# Patient Record
Sex: Female | Born: 1990 | Race: White | Hispanic: No | Marital: Married | Smoking: Never smoker
Health system: Southern US, Community
[De-identification: ages and names within clinical notes are randomized; demographics above are authoritative.]

## PROBLEM LIST (undated history)

## (undated) DIAGNOSIS — T7840XA Allergy, unspecified, initial encounter: Secondary | ICD-10-CM

## (undated) DIAGNOSIS — R51 Headache: Secondary | ICD-10-CM

## (undated) DIAGNOSIS — R519 Headache, unspecified: Secondary | ICD-10-CM

## (undated) DIAGNOSIS — N946 Dysmenorrhea, unspecified: Secondary | ICD-10-CM

## (undated) DIAGNOSIS — Z8619 Personal history of other infectious and parasitic diseases: Secondary | ICD-10-CM

## (undated) DIAGNOSIS — F419 Anxiety disorder, unspecified: Secondary | ICD-10-CM

## (undated) HISTORY — DX: Allergy, unspecified, initial encounter: T78.40XA

## (undated) HISTORY — DX: Dysmenorrhea, unspecified: N94.6

## (undated) HISTORY — PX: TONSILLECTOMY: SUR1361

## (undated) HISTORY — PX: MYRINGOTOMY: SHX2060

## (undated) HISTORY — PX: TYMPANOSTOMY TUBE PLACEMENT: SHX32

---
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
## 2010-03-11 ENCOUNTER — Ambulatory Visit: Payer: Self-pay | Admitting: Internal Medicine

## 2010-03-11 LAB — CONVERTED CEMR LAB: Hemoglobin: 14.2 g/dL

## 2010-03-16 LAB — CONVERTED CEMR LAB: GC Probe Amp, Urine: NEGATIVE

## 2010-05-25 ENCOUNTER — Ambulatory Visit: Payer: Self-pay | Admitting: Internal Medicine

## 2010-05-25 DIAGNOSIS — T50995A Adverse effect of other drugs, medicaments and biological substances, initial encounter: Secondary | ICD-10-CM

## 2010-05-25 DIAGNOSIS — F4320 Adjustment disorder, unspecified: Secondary | ICD-10-CM | POA: Insufficient documentation

## 2010-05-25 LAB — CONVERTED CEMR LAB: Thyroperoxidase Ab SerPl-aCnc: 48.2 (ref 0.0–60.0)

## 2010-05-27 LAB — CONVERTED CEMR LAB: Free T4: 0.81 ng/dL (ref 0.60–1.60)
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
## 2010-12-01 NOTE — Assessment & Plan Note (Signed)
Summary: cpx/ssc   Vital Signs:  Patient profile:   20 year old female Menstrual status:  regular LMP:     02/17/2010 Height:      63.75 inches Weight:      179 pounds BMI:     31.08 Pulse rate:   78 / minute BP sitting:   100 / 60  (left arm) Cuff size:   regular  Vitals Entered By: Romualdo Bolk, CMA [place]ull) (Mar 11, 2010 1:07 PM)  History of Present Illness: Alison Wagner comes in comes in today  for check up. Since last visit  here  there have been no major changes in health status  .    Dysmenorrhea: ocps helped her periods but till has sig cramps despite  nasids .   NO vag discharge  uses condoms  only 1 partner.  No execessive bleeding . No NVD .  CC: CPX LMP (date): 02/17/2010 LMP - Character: normal Menarche (age onset years): 11   Menses interval (days): 28 Menstrual flow (days): 4 Enter LMP: 02/17/2010   Preventive Screening-Counseling & Management  Alcohol-Tobacco     Alcohol drinks/day: tried-not currently using     Smoking Status: quit     Passive Smoke Exposure: yes  Caffeine-Diet-Exercise     Caffeine use/day: no carbonated, no caffeine     Diet Comments: all four food groups, picky eater  Hep-HIV-STD-Contraception     Dental Visit-last 6 months yes     Sun Exposure-Excessive: yes  Safety-Violence-Falls     Seat Belt Use: yes     Firearms in the Home: no firearms in the home     Smoke Detectors: yes      Blood Transfusions:  no.    Past History:  Past medical, surgical, family and social histories (including risk factors) reviewed, and no changes noted (except as noted below).  Past Medical History: Allergic rhinitis mild seasonal allergies  dysmenorrhea  Past Surgical History: Reviewed history from 04/05/2007 and no changes required. Myringotomy: P/E tubes  Past History:  Care Management: [facility] for acute problems due to it being closer to home.  Family History: Reviewed history from 09/25/2007 and no  changes required. ? gp with dm no clotting  no suddendeath Family History of Thyroid Disease mom  Social History: Reviewed history from 03/09/2009 and no changes required. 2 households mom in [place]  Lives with DAD here ocass etoh or tob with friends Parents are divorced; lives with: . Father  mom in [place]    [place] .    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   Seat Belt Use:  yes Sun Exposure-Excessive:  yes Blood Transfusions:  no  Review of Systems  The patient denies anorexia, fever, weight loss, weight gain, decreased hearing, prolonged cough, abdominal pain, hematochezia, severe indigestion/heartburn, hematuria, incontinence, genital sores, difficulty walking, depression, unusual weight change, abnormal bleeding, enlarged lymph nodes, angioedema, and breast masses.    Physical Exam  General:      Well appearing adolescent,no acute distress Head:      normocephalic and atraumatic  Eyes:      PERRL, EOMs full, conjunctiva clear  Ears:      TM's pearly gray with normal light reflex and landmarks, canals clear  Nose:      Clear without Rhinorrhea Mouth:      Clear without erythema, edema or exudate, mucous membranes moist teeht good repair Neck:      supple without  adenopathy  Chest wall:      no deformities or breast masses noted.  Tanner V Breast.   Lungs:      Clear to ausc, no crackles, rhonchi or wheezing, no grunting, flaring or retractions  Heart:      RRR without murmur quiet precordium.   Abdomen:      BS+, soft, non-tender, no masses, no hepatosplenomegaly  Genitalia:      normal female  Musculoskeletal:      no scoliosis, normal gait, normal posture ortho neg  Pulses:      femoral pulses present wihtout delay  Extremities:      Well perfused with no cyanosis or deformity noted  Neurologic:      Neurologic exam   intact  Developmental:      alert and cooperative  Skin:      intact without lesions, rashes   Cervical nodes:       no significant adenopathy.   Axillary nodes:      no significant adenopathy.   Inguinal nodes:      no significant adenopathy.   Psychiatric:      alert and cooperative   Impression & Recommendations:  Problem # 1:  PREVENTIVE HEALTH CARE (ICD-V70.0) Discussed nutrition,exercise,diet,healthy weight, vitamin D and calcium.      Orders: Hgb (91478) T-Chlamydia & GC Probe, Urine ([id number]) Fingerstick (29562) Specimen Handling (13086)  Problem # 2:  PRIMARY DYSMENORRHEA (ICD-625.3) Assessment: Deteriorated still problematic   will change estrogen  level of ocps   and   follow up  add rx nsaid .  Problem # 3:  SCREENING EXAMINATION FOR VENEREAL DISEASE (ICD-V74.5)  Orders: T-Chlamydia & GC Probe, Urine ([id number])  Complete Medication List: 1)  Levora 0.15/30 (28) 0.15-30 Mg-mcg Tabs (Levonorgestrel-ethinyl estrad) .Marland Kitchen.. 1 by mouth once daily 2)  Anaprox Ds 550 Mg Tabs (Naproxen sodium) .Marland Kitchen.. 1 by mouth two times a day for cramps  Patient Instructions: 1)  change OCPS  2)  take cramp antiinflammatory med early for pain. 3)  You will be informed of lab results when available. 4)  Please schedule a follow-up appointment in 3 months .  or as needed.  Prescriptions: ANAPROX DS 550 MG TABS (NAPROXEN SODIUM) 1 by mouth two times a day for cramps  #60 x 2   Entered and Authorized by:   Madelin Headings MD   Signed by:   Madelin Headings MD on 03/11/2010   Method used:   Electronically to        [facility] [id number] (retail)       [address] [place], [place]  [address]       Ph: [phone]       Fax: [phone]   RxID:   [id number] LEVORA 0.15/30 (28) 0.15-30 MG-MCG TABS (LEVONORGESTREL-ETHINYL ESTRAD) 1 by mouth once daily  #1 x 12   Entered and Authorized by:   Madelin Headings MD   Signed by:   Madelin Headings MD on 03/11/2010   Method used:   Electronically to        [facility] [address] (retail)       [address] [place], [place]   [address]       Ph: [phone]       Fax: [phone]   RxID:   [id number]  ] Laboratory Results   Blood Tests   Date/Time Recieved: Mar 11, 2010 1:54 PM  Date/Time Reported: Mar 11, 2010 1:54 PM    CBC HGB:  14.2 g/dL   (Normal Range: 16.1-09.6 in Males, 12.0-15.0 in Females) Comments: Wynona Canes, CMA  Mar 11, 2010 1:54 PM

## 2011-02-27 ENCOUNTER — Other Ambulatory Visit: Payer: Self-pay | Admitting: Internal Medicine

## 2011-02-27 ENCOUNTER — Other Ambulatory Visit: Payer: Self-pay | Admitting: *Deleted

## 2011-02-27 ENCOUNTER — Telehealth: Payer: Self-pay | Admitting: *Deleted

## 2011-02-27 MED ORDER — LEVONORGESTREL-ETHINYL ESTRAD 0.15-30 MG-MCG PO TABS: 1.0000 | ORAL_TABLET | Freq: Every day | ORAL | Status: DC

## 2011-02-27 NOTE — Telephone Encounter (Signed)
Rx sent to pharmacy   

## 2011-02-27 NOTE — Telephone Encounter (Signed)
Will be out of Levora birth control next Monday.  Appt schedule for 03/21/11 for yearly check up. Would like 1 refill called in until appt.

## 2011-03-16 ENCOUNTER — Encounter: Payer: Self-pay | Admitting: Internal Medicine

## 2011-03-21 ENCOUNTER — Ambulatory Visit (INDEPENDENT_AMBULATORY_CARE_PROVIDER_SITE_OTHER): Payer: Federal, State, Local not specified - PPO | Admitting: Internal Medicine

## 2011-03-21 ENCOUNTER — Encounter: Payer: Self-pay | Admitting: Internal Medicine

## 2011-03-21 VITALS — BP 130/80 | HR 78 | Ht 64.0 in | Wt 179.0 lb

## 2011-03-21 DIAGNOSIS — Z0000 Encounter for general adult medical examination without abnormal findings: Secondary | ICD-10-CM

## 2011-03-21 DIAGNOSIS — N946 Dysmenorrhea, unspecified: Secondary | ICD-10-CM

## 2011-03-21 DIAGNOSIS — IMO0001 Reserved for inherently not codable concepts without codable children: Secondary | ICD-10-CM | POA: Insufficient documentation

## 2011-03-21 DIAGNOSIS — R109 Unspecified abdominal pain: Secondary | ICD-10-CM | POA: Insufficient documentation

## 2011-03-21 LAB — POCT HEMOGLOBIN: Hemoglobin: 13.7

## 2011-03-21 MED ORDER — LEVONORGESTREL-ETHINYL ESTRAD 0.1-20 MG-MCG PO TABS: 1.0000 | ORAL_TABLET | Freq: Every day | ORAL | Status: DC

## 2011-03-21 NOTE — Patient Instructions (Signed)
Will do gyne referral  About your pain Track the pain episodes and  Periods. unclear if you could have a cyst/ endometriosis  Or there causes of your pain.  Follow up with me about pain  Not occurring with the period.    Before you back to school

## 2011-03-21 NOTE — Progress Notes (Signed)
  Subjective:    Patient ID: Alison Wagner, female    DOB: 05-06-1991, 20 y.o.   MRN: [id number]  HPI Patient comes in for a checkup preventive visit today. Since her last visit we had changed her birth control pill because of breakthrough cramps. She states that she still gets cramps during her period. Pain      With menses and takes naproxy but also gets pain occasionally at other times about 2 x per months? If  Related . No nausea vomiting diarrhea feels like cramps no change in bowel habits. Had sti checks within 6 months at  [facility]    2 year relationship.  Wants advice and input about how to manage the pain with her she would benefit with a muscle relaxer or   other pain pill. Sib has endometriosis   .  Dr Aldona Bar  Review of Systems Negative HEENT negative cardiovascular pulmonary change in bowel habits joint pains bruising bleeding depression. Rest as per history of present illness    Objective:   Physical Exam Physical Exam: Vital signs reviewed [id number] is a well-developed well-nourished alert cooperative  white female who appears her stated age in no acute distress.  HEENT: normocephalic  traumatic , Eyes: PERRL EOM's full, conjunctiva clear, Nares: paten,t no deformity discharge or tenderness., Ears: no deformity EAC's clear TMs with normal landmarks. Mouth: clear OP, no lesions, edema.  Moist mucous membranes. Dentition in adequate repair. NECK: supple without masses, thyromegaly or bruits. CHEST/PULM:  Clear to auscultation and percussion breath sounds equal no wheeze , rales or rhonchi. No chest wall deformities or tenderness. Breast: normal by inspection . No dimpling, discharge, masses, tenderness or discharge . LN: no cervical axillary inguinal adenopathy CV: PMI is nondisplaced, S1 S2 no gallops, murmurs, rubs. Peripheral pulses are full without delay.No JVD .  ABDOMEN: Bowel sounds normal nontender  No guard or rebound, no hepato splenomegal no CVA tenderness.  No hernia.  Points to right pelvic area as area of concern when hurts.  Extremtities:  No clubbing cyanosis or edema, no acute joint swelling or redness no focal atrophy NEURO:  Oriented x3, cranial nerves 3-12 appear to be intact, no obvious focal weakness,gait within normal limits no abnormal reflexes or asymmetrical SKIN: No acute rashes normal turgor, color, no bruising or petechiae. Piercing.  PSYCH: Oriented, good eye contact, no obvious depression anxiety, cognition and judgment appear normal.     Assessment & Plan:  Preventive Health Care UTD  Had sti screen at school  Counseled regarding healthy nutrition, exercise, sleep, injury prevention, calcium vit d and healthy weight .  OCPs    Dysmenorrhea   Still break through with other abd pain. Unclear cause  Would avoid  Narcotics  And other type pain pills at present until  reason for pain  Better delineated. ? endometriosis or cyst?    Counseled. About course and  REC.   GYNE consult to  Consider other treatments.

## 2011-07-27 LAB — POCT PREGNANCY, URINE
Operator id: 284251
Preg Test, Ur: NEGATIVE

## 2011-07-27 LAB — URINALYSIS, ROUTINE W REFLEX MICROSCOPIC
Bilirubin Urine: NEGATIVE
Nitrite: NEGATIVE
Protein, ur: NEGATIVE
Specific Gravity, Urine: 1.007
Urobilinogen, UA: 0.2

## 2011-07-27 LAB — RAPID URINE DRUG SCREEN, HOSP PERFORMED
Amphetamines: NOT DETECTED
Cocaine: NOT DETECTED
Opiates: NOT DETECTED
Tetrahydrocannabinol: NOT DETECTED

## 2011-09-12 ENCOUNTER — Other Ambulatory Visit: Payer: Self-pay | Admitting: Internal Medicine

## 2012-02-24 ENCOUNTER — Other Ambulatory Visit: Payer: Self-pay | Admitting: Internal Medicine

## 2012-02-26 ENCOUNTER — Telehealth: Payer: Self-pay | Admitting: Internal Medicine

## 2012-02-26 NOTE — Telephone Encounter (Signed)
Pt requesting refill on LUTERA 0.1-20 MG-MCG tablet  [facility] [address] in [place]

## 2012-02-27 NOTE — Telephone Encounter (Signed)
Rx sent to pharmacy   

## 2012-03-26 ENCOUNTER — Encounter (HOSPITAL_BASED_OUTPATIENT_CLINIC_OR_DEPARTMENT_OTHER): Payer: Self-pay | Admitting: *Deleted

## 2012-03-26 ENCOUNTER — Telehealth: Payer: Self-pay | Admitting: Internal Medicine

## 2012-03-26 DIAGNOSIS — S301XXA Contusion of abdominal wall, initial encounter: Secondary | ICD-10-CM | POA: Insufficient documentation

## 2012-03-26 DIAGNOSIS — M79609 Pain in unspecified limb: Secondary | ICD-10-CM | POA: Insufficient documentation

## 2012-03-26 DIAGNOSIS — S62339A Displaced fracture of neck of unspecified metacarpal bone, initial encounter for closed fracture: Secondary | ICD-10-CM | POA: Insufficient documentation

## 2012-03-26 DIAGNOSIS — R109 Unspecified abdominal pain: Secondary | ICD-10-CM | POA: Insufficient documentation

## 2012-03-26 DIAGNOSIS — S60229A Contusion of unspecified hand, initial encounter: Secondary | ICD-10-CM | POA: Insufficient documentation

## 2012-03-26 DIAGNOSIS — S8010XA Contusion of unspecified lower leg, initial encounter: Secondary | ICD-10-CM | POA: Insufficient documentation

## 2012-03-26 MED ORDER — LEVONORGESTREL-ETHINYL ESTRAD 0.1-20 MG-MCG PO TABS: 1.0000 | ORAL_TABLET | Freq: Every day | ORAL | Status: DC

## 2012-03-26 NOTE — Telephone Encounter (Signed)
Pt called and is req refill of LUTERA 0.1-20 MG-MCG tablet to be called in to [facility] in [place].

## 2012-03-26 NOTE — Telephone Encounter (Signed)
Per Dr. Fabian Sharp called pt to see what happended with GYNE referral and to make pt aware. Pt is aware and states she will make an appt. Rx sent to pharmacy.
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
## 2012-03-27 ENCOUNTER — Emergency Department (HOSPITAL_BASED_OUTPATIENT_CLINIC_OR_DEPARTMENT_OTHER): Payer: Federal, State, Local not specified - PPO

## 2012-03-27 ENCOUNTER — Emergency Department (HOSPITAL_BASED_OUTPATIENT_CLINIC_OR_DEPARTMENT_OTHER)
Admission: EM | Admit: 2012-03-27 | Discharge: 2012-03-27 | Disposition: A | Discharge status: Home / Self Care | Payer: Federal, State, Local not specified - PPO | Attending: Emergency Medicine | Admitting: Emergency Medicine

## 2012-03-27 DIAGNOSIS — S92352A Displaced fracture of fifth metatarsal bone, left foot, initial encounter for closed fracture: Secondary | ICD-10-CM

## 2012-03-27 LAB — URINALYSIS, ROUTINE W REFLEX MICROSCOPIC
Bilirubin Urine: NEGATIVE
Ketones, ur: NEGATIVE mg/dL
Nitrite: NEGATIVE
Urobilinogen, UA: 0.2 mg/dL (ref 0.0–1.0)

## 2012-03-27 MED ORDER — SODIUM CHLORIDE 0.9 % IV SOLN
INTRAVENOUS | Status: DC
  Administered 2012-03-27: 03:00:00 via INTRAVENOUS

## 2012-03-27 MED ORDER — MORPHINE SULFATE 4 MG/ML IJ SOLN
6.0000 mg | Freq: Once | INTRAMUSCULAR | Status: AC
  Administered 2012-03-27: 6 mg via INTRAVENOUS
  Filled 2012-03-27: qty 2

## 2012-03-27 MED ORDER — IOHEXOL 300 MG/ML  SOLN
100.0000 mL | Freq: Once | INTRAMUSCULAR | Status: AC | PRN
  Administered 2012-03-27: 100 mL via INTRAVENOUS

## 2012-03-27 MED ORDER — HYDROCODONE-ACETAMINOPHEN 5-325 MG PO TABS: 2.0000 | ORAL_TABLET | ORAL | Status: AC | PRN

## 2012-03-27 NOTE — ED Notes (Signed)
MD at bedside. 
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
## 2012-03-27 NOTE — ED Notes (Signed)
Pt ambulated to restroom with a steady gait. NAD noted.

## 2012-03-27 NOTE — ED Notes (Signed)
Patient transported to X-ray 

## 2012-03-29 ENCOUNTER — Ambulatory Visit: Payer: Federal, State, Local not specified - PPO | Admitting: Internal Medicine

## 2012-04-02 ENCOUNTER — Telehealth: Payer: Self-pay | Admitting: Internal Medicine

## 2012-04-02 NOTE — Telephone Encounter (Signed)
Triage the call  If needed she can come for problem based  Office visit   Before  Scheduled preventive visit .

## 2012-04-02 NOTE — Telephone Encounter (Signed)
Pls advise.  
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
## 2012-04-03 NOTE — Telephone Encounter (Signed)
Left a message for pt to return call 

## 2012-04-04 NOTE — Telephone Encounter (Signed)
Left a message for pt to return call 
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
## 2012-04-04 NOTE — Telephone Encounter (Signed)
If she wishes can come in next Tuesday JUne 11th 1:15 block 30 minutes

## 2012-04-05 NOTE — Telephone Encounter (Signed)
Pls schedule

## 2012-04-05 NOTE — Telephone Encounter (Signed)
Left message for pt to call back  °

## 2012-04-08 NOTE — Telephone Encounter (Signed)
Left message for pt to return call.

## 2012-04-09 ENCOUNTER — Encounter: Payer: Self-pay | Admitting: Internal Medicine

## 2012-04-09 ENCOUNTER — Other Ambulatory Visit (HOSPITAL_COMMUNITY)
Admission: RE | Admit: 2012-04-09 | Discharge: 2012-04-09 | Disposition: A | Discharge status: Home / Self Care | Payer: Federal, State, Local not specified - PPO | Source: Ambulatory Visit | Attending: Internal Medicine | Admitting: Internal Medicine

## 2012-04-09 ENCOUNTER — Ambulatory Visit (INDEPENDENT_AMBULATORY_CARE_PROVIDER_SITE_OTHER): Payer: Federal, State, Local not specified - PPO | Admitting: Internal Medicine

## 2012-04-09 VITALS — BP 126/86 | HR 89 | Temp 98.3°F | Ht 64.5 in | Wt 177.0 lb

## 2012-04-09 DIAGNOSIS — T07XXXA Unspecified multiple injuries, initial encounter: Secondary | ICD-10-CM

## 2012-04-09 DIAGNOSIS — Z01419 Encounter for gynecological examination (general) (routine) without abnormal findings: Secondary | ICD-10-CM

## 2012-04-09 DIAGNOSIS — S62309A Unspecified fracture of unspecified metacarpal bone, initial encounter for closed fracture: Secondary | ICD-10-CM

## 2012-04-09 DIAGNOSIS — Z0000 Encounter for general adult medical examination without abnormal findings: Secondary | ICD-10-CM

## 2012-04-09 DIAGNOSIS — Z113 Encounter for screening for infections with a predominantly sexual mode of transmission: Secondary | ICD-10-CM | POA: Insufficient documentation

## 2012-04-09 LAB — HEPATIC FUNCTION PANEL
AST: 19 U/L (ref 0–37)
Albumin: 3.8 g/dL (ref 3.5–5.2)
Total Bilirubin: 0.3 mg/dL (ref 0.3–1.2)

## 2012-04-09 LAB — CBC WITH DIFFERENTIAL/PLATELET
Basophils Absolute: 0 10*3/uL (ref 0.0–0.1)
Eosinophils Absolute: 0.1 10*3/uL (ref 0.0–0.7)
HCT: 38.7 % (ref 36.0–46.0)
Lymphs Abs: 2.1 10*3/uL (ref 0.7–4.0)
MCHC: 33.1 g/dL (ref 30.0–36.0)
Monocytes Absolute: 0.3 10*3/uL (ref 0.1–1.0)
Monocytes Relative: 4 % (ref 3.0–12.0)
Platelets: 238 10*3/uL (ref 150.0–400.0)
RDW: 12.3 % (ref 11.5–14.6)

## 2012-04-09 LAB — BASIC METABOLIC PANEL
BUN: 8 mg/dL (ref 6–23)
CO2: 27 mEq/L (ref 19–32)
Calcium: 8.8 mg/dL (ref 8.4–10.5)
GFR: 126.28 mL/min (ref >60.00)
Glucose, Bld: 69 mg/dL — ABNORMAL LOW (ref 70–99)
Potassium: 3.9 mEq/L (ref 3.5–5.1)

## 2012-04-09 LAB — LIPID PANEL
Cholesterol: 131 mg/dL (ref 0–200)
HDL: 41.7 mg/dL (ref >39.00)
LDL Cholesterol: 71 mg/dL (ref 0–99)
Triglycerides: 91 mg/dL (ref 0.0–149.0)
VLDL: 18.2 mg/dL (ref 0.0–40.0)

## 2012-04-09 MED ORDER — LEVONORGESTREL-ETHINYL ESTRAD 0.1-20 MG-MCG PO TABS: 1.0000 | ORAL_TABLET | Freq: Every day | ORAL | Status: DC

## 2012-04-09 NOTE — Patient Instructions (Signed)
Will notify you  of lab PAP  when available. Continue same med  Recheck in a year or as needed
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
## 2012-04-10 ENCOUNTER — Encounter: Payer: Self-pay | Admitting: Family Medicine

## 2012-04-10 NOTE — Progress Notes (Signed)
Left a message on voicemail for the pt to call back. 

## 2012-04-10 NOTE — Progress Notes (Signed)
Left a message on voicemail for the pt to call back.

## 2012-04-11 NOTE — Progress Notes (Signed)
Quick Note:  Tell patient PAP is normal. ______ 

## 2012-04-12 ENCOUNTER — Encounter: Payer: Self-pay | Admitting: Family Medicine

## 2012-04-16 ENCOUNTER — Encounter: Payer: Self-pay | Admitting: Internal Medicine

## 2012-04-16 DIAGNOSIS — T07XXXA Unspecified multiple injuries, initial encounter: Secondary | ICD-10-CM | POA: Insufficient documentation

## 2012-04-16 DIAGNOSIS — Z01419 Encounter for gynecological examination (general) (routine) without abnormal findings: Secondary | ICD-10-CM | POA: Insufficient documentation

## 2012-04-16 DIAGNOSIS — S62309A Unspecified fracture of unspecified metacarpal bone, initial encounter for closed fracture: Secondary | ICD-10-CM | POA: Insufficient documentation

## 2012-04-26 ENCOUNTER — Encounter: Payer: Federal, State, Local not specified - PPO | Admitting: Internal Medicine

## 2013-08-11 ENCOUNTER — Encounter: Payer: Self-pay | Admitting: Internal Medicine

## 2013-08-11 ENCOUNTER — Ambulatory Visit (INDEPENDENT_AMBULATORY_CARE_PROVIDER_SITE_OTHER): Payer: Federal, State, Local not specified - PPO | Admitting: Internal Medicine

## 2013-08-11 VITALS — BP 120/84 | HR 68 | Temp 98.2°F | Wt 185.0 lb

## 2013-08-11 DIAGNOSIS — Z0000 Encounter for general adult medical examination without abnormal findings: Secondary | ICD-10-CM

## 2013-08-11 DIAGNOSIS — Z3009 Encounter for other general counseling and advice on contraception: Secondary | ICD-10-CM

## 2013-08-11 DIAGNOSIS — L708 Other acne: Secondary | ICD-10-CM

## 2013-08-11 DIAGNOSIS — Z23 Encounter for immunization: Secondary | ICD-10-CM

## 2013-08-11 DIAGNOSIS — L709 Acne, unspecified: Secondary | ICD-10-CM

## 2013-08-11 DIAGNOSIS — Z30011 Encounter for initial prescription of contraceptive pills: Secondary | ICD-10-CM

## 2013-08-11 MED ORDER — LEVONORGESTREL-ETHINYL ESTRAD 0.1-20 MG-MCG PO TABS: 1.0000 | ORAL_TABLET | Freq: Every day | ORAL | Status: DC

## 2013-08-11 MED ORDER — DOXYCYCLINE HYCLATE 100 MG PO CAPS: 100.0000 mg | ORAL_CAPSULE | Freq: Two times a day (BID) | ORAL | Status: DC

## 2013-08-11 MED ORDER — ADAPALENE 0.1 % EX CREA: TOPICAL_CREAM | Freq: Every day | CUTANEOUS | Status: DC

## 2013-08-11 NOTE — Patient Instructions (Addendum)
Begin ocps again as this can help the acne  Add antibiotic and  Evening Differin pea sized amount.   Wash in am   ROV in 2-3 months or as needed.     Acne Acne is a skin problem that causes pimples. Acne occurs when the pores in your skin get blocked. Your pores may become red, sore, and swollen (inflamed), or infected with a common skin bacterium (Propionibacterium acnes). Acne is a common skin problem. Up to 80% of people get acne at some time. Acne is especially common from the ages of 51 to 69. Acne usually goes away over time with proper treatment. CAUSES  Your pores each contain an oil gland. The oil glands make an oily substance called sebum. Acne happens when these glands get plugged with sebum, dead skin cells, and dirt. The P. acnes bacteria that are normally found in the oil glands then multiply, causing inflammation. Acne is commonly triggered by changes in your hormones. These hormonal changes can cause the oil glands to get bigger and to make more sebum. Factors that can make acne worse include:  Hormone changes during adolescence.  Hormone changes during women's menstrual cycles.  Hormone changes during pregnancy.  Oil-based cosmetics and hair products.  Harshly scrubbing the skin.  Strong soaps.  Stress.  Hormone problems due to certain diseases.  Long or oily hair rubbing against the skin.  Certain medicines.  Pressure from headbands, backpacks, or shoulder pads.  Exposure to certain oils and chemicals. SYMPTOMS  Acne often occurs on the face, neck, chest, and upper back. Symptoms include:  Small, red bumps (pimples or papules).  Whiteheads (closed comedones).  Blackheads (open comedones).  Small, pus-filled pimples (pustules).  Big, red pimples or pustules that feel tender. More severe acne can cause:  An infected area that contains a collection of pus (abscess).  Hard, painful, fluid-filled sacs (cysts).  Scars. DIAGNOSIS  Your caregiver can  usually tell what the problem is by doing a physical exam. TREATMENT  There are many good treatments for acne. Some are available over-the-counter and some are available with a prescription. The treatment that is best for you depends on the type of acne you have and how severe it is. It may take 2 months of treatment before your acne gets better. Common treatments include:  Creams and lotions that prevent oil glands from clogging.  Creams and lotions that treat or prevent infections and inflammation.  Antibiotics applied to the skin or taken as a pill.  Pills that decrease sebum production.  Birth control pills.  Light or laser treatments.  Minor surgery.  Injections of medicine into the affected areas.  Chemicals that cause peeling of the skin. HOME CARE INSTRUCTIONS  Good skin care is the most important part of treatment.  Wash your skin gently at least twice a day and after exercise. Always wash your skin before bed.  Use mild soap.  After each wash, apply a water-based skin moisturizer.  Keep your hair clean and off of your face. Shampoo your hair daily.  Only take medicines as directed by your caregiver.  Use a sunscreen or sunblock with SPF 30 or greater. This is especially important when you are using acne medicines.  Choose cosmetics that are noncomedogenic. This means they do not plug the oil glands.  Avoid leaning your chin or forehead on your hands.  Avoid wearing tight headbands or hats.  Avoid picking or squeezing your pimples. This can make your acne worse and cause  scarring. SEEK MEDICAL CARE IF:   Your acne is not better after 8 weeks.  Your acne gets worse.  You have a large area of skin that is red or tender. Document Released: 10/13/2000 Document Revised: 01/08/2012 Document Reviewed: 08/04/2011 [facility] Patient Information 2014 [place], [place].  Preventive Care for Adults, Female A healthy lifestyle and preventive care can promote health and  wellness. Preventive health guidelines for women include the following key practices.  A routine yearly physical is a good way to check with your caregiver about your health and preventive screening. It is a chance to share any concerns and updates on your health, and to receive a thorough exam.  Visit your dentist for a routine exam and preventive care every 6 months. Brush your teeth twice a day and floss once a day. Good oral hygiene prevents tooth decay and gum disease.  The frequency of eye exams is based on your age, health, family medical history, use of contact lenses, and other factors. Follow your caregiver's recommendations for frequency of eye exams.  Eat a healthy diet. Foods like vegetables, fruits, whole grains, low-fat dairy products, and lean protein foods contain the nutrients you need without too many calories. Decrease your intake of foods high in solid fats, added sugars, and salt. Eat the right amount of calories for you.Get information about a proper diet from your caregiver, if necessary.  Regular physical exercise is one of the most important things you can do for your health. Most adults should get at least 150 minutes of moderate-intensity exercise (any activity that increases your heart rate and causes you to sweat) each week. In addition, most adults need muscle-strengthening exercises on 2 or more days a week.  Maintain a healthy weight. The body mass index (BMI) is a screening tool to identify possible weight problems. It provides an estimate of body fat based on height and weight. Your caregiver can help determine your BMI, and can help you achieve or maintain a healthy weight.For adults 20 years and older:  A BMI below 18.5 is considered underweight.  A BMI of 18.5 to 24.9 is normal.  A BMI of 25 to 29.9 is considered overweight.  A BMI of 30 and above is considered obese.  Maintain normal blood lipids and cholesterol levels by exercising and minimizing your  intake of saturated fat. Eat a balanced diet with plenty of fruit and vegetables. Blood tests for lipids and cholesterol should begin at age 67 and be repeated every 5 years. If your lipid or cholesterol levels are high, you are over 50, or you are at high risk for heart disease, you may need your cholesterol levels checked more frequently.Ongoing high lipid and cholesterol levels should be treated with medicines if diet and exercise are not effective.  If you smoke, find out from your caregiver how to quit. If you do not use tobacco, do not start.  If you are pregnant, do not drink alcohol. If you are breastfeeding, be very cautious about drinking alcohol. If you are not pregnant and choose to drink alcohol, do not exceed 1 drink per day. One drink is considered to be 12 ounces (355 mL) of beer, 5 ounces (148 mL) of wine, or 1.5 ounces (44 mL) of liquor.  Avoid use of street drugs. Do not share needles with anyone. Ask for help if you need support or instructions about stopping the use of drugs.  High blood pressure causes heart disease and increases the risk of stroke. Your blood  pressure should be checked at least every 1 to 2 years. Ongoing high blood pressure should be treated with medicines if weight loss and exercise are not effective.  If you are 16 to 22 years old, ask your caregiver if you should take aspirin to prevent strokes.  Diabetes screening involves taking a blood sample to check your fasting blood sugar level. This should be done once every 3 years, after age 15, if you are within normal weight and without risk factors for diabetes. Testing should be considered at a younger age or be carried out more frequently if you are overweight and have at least 1 risk factor for diabetes.  Breast cancer screening is essential preventive care for women. You should practice "breast self-awareness." This means understanding the normal appearance and feel of your breasts and may include breast  self-examination. Any changes detected, no matter how small, should be reported to a caregiver. Women in their 69s and 30s should have a clinical breast exam (CBE) by a caregiver as part of a regular health exam every 1 to 3 years. After age 30, women should have a CBE every year. Starting at age 20, women should consider having a mammography (breast X-ray test) every year. Women who have a family history of breast cancer should talk to their caregiver about genetic screening. Women at a high risk of breast cancer should talk to their caregivers about having magnetic resonance imaging (MRI) and a mammography every year.  The Pap test is a screening test for cervical cancer. A Pap test can show cell changes on the cervix that might become cervical cancer if left untreated. A Pap test is a procedure in which cells are obtained and examined from the lower end of the uterus (cervix).  Women should have a Pap test starting at age 73.  Between ages 17 and 81, Pap tests should be repeated every 2 years.  Beginning at age 61, you should have a Pap test every 3 years as long as the past 3 Pap tests have been normal.  Some women have medical problems that increase the chance of getting cervical cancer. Talk to your caregiver about these problems. It is especially important to talk to your caregiver if a new problem develops soon after your last Pap test. In these cases, your caregiver may recommend more frequent screening and Pap tests.  The above recommendations are the same for women who have or have not gotten the vaccine for human papillomavirus (HPV).  If you had a hysterectomy for a problem that was not cancer or a condition that could lead to cancer, then you no longer need Pap tests. Even if you no longer need a Pap test, a regular exam is a good idea to make sure no other problems are starting.  If you are between ages 5 and 60, and you have had normal Pap tests going back 10 years, you no longer  need Pap tests. Even if you no longer need a Pap test, a regular exam is a good idea to make sure no other problems are starting.  If you have had past treatment for cervical cancer or a condition that could lead to cancer, you need Pap tests and screening for cancer for at least 20 years after your treatment.  If Pap tests have been discontinued, risk factors (such as a new sexual partner) need to be reassessed to determine if screening should be resumed.  The HPV test is an additional test that may  be used for cervical cancer screening. The HPV test looks for the virus that can cause the cell changes on the cervix. The cells collected during the Pap test can be tested for HPV. The HPV test could be used to screen women aged 25 years and older, and should be used in women of any age who have unclear Pap test results. After the age of 50, women should have HPV testing at the same frequency as a Pap test.  Colorectal cancer can be detected and often prevented. Most routine colorectal cancer screening begins at the age of 11 and continues through age 64. However, your caregiver may recommend screening at an earlier age if you have risk factors for colon cancer. On a yearly basis, your caregiver may provide home test kits to check for hidden blood in the stool. Use of a small camera at the end of a tube, to directly examine the colon (sigmoidoscopy or colonoscopy), can detect the earliest forms of colorectal cancer. Talk to your caregiver about this at age 67, when routine screening begins. Direct examination of the colon should be repeated every 5 to 10 years through age 86, unless early forms of pre-cancerous polyps or small growths are found.  Hepatitis C blood testing is recommended for all people born from [birth year] through 1965 and any individual with known risks for hepatitis C.  Practice safe sex. Use condoms and avoid high-risk sexual practices to reduce the spread of sexually transmitted infections  (STIs). STIs include gonorrhea, chlamydia, syphilis, trichomonas, herpes, HPV, and human immunodeficiency virus (HIV). Herpes, HIV, and HPV are viral illnesses that have no cure. They can result in disability, cancer, and death. Sexually active women aged 20 and younger should be checked for chlamydia. Older women with new or multiple partners should also be tested for chlamydia. Testing for other STIs is recommended if you are sexually active and at increased risk.  Osteoporosis is a disease in which the bones lose minerals and strength with aging. This can result in serious bone fractures. The risk of osteoporosis can be identified using a bone density scan. Women ages 44 and over and women at risk for fractures or osteoporosis should discuss screening with their caregivers. Ask your caregiver whether you should take a calcium supplement or vitamin D to reduce the rate of osteoporosis.  Menopause can be associated with physical symptoms and risks. Hormone replacement therapy is available to decrease symptoms and risks. You should talk to your caregiver about whether hormone replacement therapy is right for you.  Use sunscreen with sun protection factor (SPF) of 30 or more. Apply sunscreen liberally and repeatedly throughout the day. You should seek shade when your shadow is shorter than you. Protect yourself by wearing long sleeves, pants, a wide-brimmed hat, and sunglasses year round, whenever you are outdoors.  Once a month, do a whole body skin exam, using a mirror to look at the skin on your back. Notify your caregiver of new moles, moles that have irregular borders, moles that are larger than a pencil eraser, or moles that have changed in shape or color.  Stay current with required immunizations.  Influenza. You need a dose every fall (or winter). The composition of the flu vaccine changes each year, so being vaccinated once is not enough.  Pneumococcal polysaccharide. You need 1 to 2 doses if  you smoke cigarettes or if you have certain chronic medical conditions. You need 1 dose at age 41 (or older) if you have never been  vaccinated.  Tetanus, diphtheria, pertussis (Tdap, Td). Get 1 dose of Tdap vaccine if you are younger than age 28, are over 68 and have contact with an infant, are a Research scientist (physical sciences), are pregnant, or simply want to be protected from whooping cough. After that, you need a Td booster dose every 10 years. Consult your caregiver if you have not had at least 3 tetanus and diphtheria-containing shots sometime in your life or have a deep or dirty wound.  HPV. You need this vaccine if you are a woman age 23 or younger. The vaccine is given in 3 doses over 6 months.  Measles, mumps, rubella (MMR). You need at least 1 dose of MMR if you were born in 1957 or later. You may also need a second dose.  Meningococcal. If you are age 65 to 12 and a first-year college student living in a residence hall, or have one of several medical conditions, you need to get vaccinated against meningococcal disease. You may also need additional booster doses.  Zoster (shingles). If you are age 72 or older, you should get this vaccine.  Varicella (chickenpox). If you have never had chickenpox or you were vaccinated but received only 1 dose, talk to your caregiver to find out if you need this vaccine.  Hepatitis A. You need this vaccine if you have a specific risk factor for hepatitis A virus infection or you simply wish to be protected from this disease. The vaccine is usually given as 2 doses, 6 to 18 months apart.  Hepatitis B. You need this vaccine if you have a specific risk factor for hepatitis B virus infection or you simply wish to be protected from this disease. The vaccine is given in 3 doses, usually over 6 months.

## 2013-08-11 NOTE — Progress Notes (Signed)
Chief Complaint  Patient presents with  . Acne    Ongoing for several months.  Washes her face every night. also wellness exam     HPI: Patient comes in today for problem evaluation. But asked for a check up. Also Pap was done last year no new exposures or symptoms. Dermatologist office   Would be 6 months for appt. Getting married in 6 months .   Graduated and stress  New job. nannying full time.  Age 22 years. Child Was on levora.  Stopped taking it  Cause of getting married ended her that it could affect fertility after being on it for while.  Off a year.  No change in body However over the last number of months she's been battling acne mostly on her face some on the upper chest. Would like some help with treatment or referral. She didn't have much acne previously but here willingness and face acne is worse. ROS: See pertinent positives and negatives per HPI. ocass luq  gass  possibly intolerant to milk but no vomiting otherwise change in bowel habits blood in stool asthmatic vision GYN problem at present. Ibuprofen for headaches  Every so often.   Past Medical History  Diagnosis Date  . Allergic rhinitis     mild seasonal allergies  . Dysmenorrhea     Family History  Problem Relation Age of Onset  . Thyroid disease      History   Social History  . Marital Status: Single    Spouse Name: N/A    Number of Children: N/A  . Years of Education: N/A   Social History Main Topics  . Smoking status: Never Smoker   . Smokeless tobacco: None  . Alcohol Use: No  . Drug Use: No  . Sexual Activity: None   Other Topics Concern  . 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 ibuprofen (ADVIL,MOTRIN) 200 MG tablet Take 400 mg by mouth every 6 (six) hours as needed. For pain      . adapalene (DIFFERIN) 0.1 % cream Apply topically at bedtime.  45 g  1  . doxycycline (VIBRAMYCIN) 100 MG capsule Take 1 capsule (100 mg total) by mouth 2 (two) times daily. For  2 weeks then 1 po qd for acne  40 capsule  3  . levonorgestrel-ethinyl estradiol (LUTERA) 0.1-20 MG-MCG tablet Take 1 tablet by mouth daily.  28 tablet  12  . [DISCONTINUED] hydrocodone-ibuprofen (VICOPROFEN) 5-200 MG per tablet Take 1-2 tablets by mouth 2 (two) times daily as needed. For pain      . [DISCONTINUED] levonorgestrel-ethinyl estradiol (LUTERA) 0.1-20 MG-MCG tablet Take 1 tablet by mouth daily.  28 tablet  12  . [DISCONTINUED] naproxen sodium (ANAPROX) 550 MG tablet Take 550 mg by mouth 2 (two) times daily as needed. For cramps      . [DISCONTINUED] traMADol (ULTRAM-ER) 300 MG 24 hr tablet Take 300 mg by mouth daily as needed.       No facility-administered encounter medications on file as of 08/11/2013.  EXAM:  BP 120/84  Pulse 68  Temp(Src) 98.2 F (36.8 C) (Oral)  Wt 185 lb (83.915 kg)  BMI 31.28 kg/m2  SpO2 99%  LMP 07/28/2013  Body mass index is 31.28 kg/(m^2).  Physical Exam: Vital signs reviewed [id number] is a well-developed well-nourished alert cooperative  white female who appears her stated age in no acute distress.  HEENT: normocephalic atraumatic , Eyes: PERRL EOM's full, conjunctiva clear, Nares: paten,t no deformity discharge or tenderness., Ears: no deformity EAC's clear TMs with normal landmarks. Mouth: clear OP, no lesions, edema.  Moist mucous membranes. Dentition in adequate repair. NECK: supple without masses, thyromegaly or bruits. CHEST/PULM:  Clear to auscultation and percussion breath sounds equal no wheeze , rales or rhonchi. No chest wall deformities or tenderness. Breast: normal by inspection . No dimpling, discharge, masses, tenderness or discharge . CV:  PMI is nondisplaced, S1 S2 no gallops, murmurs, rubs. Peripheral pulses are full without delay.No JVD .  ABDOMEN: Bowel sounds normal nontender  No guard or rebound, no hepato splenomegal no CVA tenderness.  No hernia. Extremtities:  No clubbing cyanosis or edema, no acute joint swelling or redness no focal atrophy NEURO:  Oriented x3, cranial nerves 3-12 appear to be intact, no obvious focal weakness,gait within normal limits no abnormal reflexes or asymmetrical SKIN: No acute rashes normal turgor, color, no bruising or petechiae. Scattered papular occasionally inflammatory acne on face throughout a few less than 10 on anterior chest and upper back  PSYCH: Oriented, good eye contact, no obvious depression anxiety, cognition and judgment appear normal. LN: no cervical axillary inguinal adenopathy     Lab Results  Component Value Date   WBC 8.6 04/09/2012   HGB 13.5 08/11/2013   HCT 38.7 04/09/2012   PLT 238.0 04/09/2012   GLUCOSE 69* 04/09/2012   CHOL 131 04/09/2012   TRIG 91.0 04/09/2012   HDL 41.70 04/09/2012   LDLCALC 71 04/09/2012   ALT 21 04/09/2012   AST 19 04/09/2012   NA 140 04/09/2012   K 3.9 04/09/2012   CL 107 04/09/2012   CREATININE 0.6 04/09/2012   BUN 8 04/09/2012   CO2 27 04/09/2012   TSH 1.25 04/09/2012     ASSESSMENT AND PLAN:  Discussed the following assessment and plan:  Visit for preventive health examination - Plan: POC Hemoglobin  Acne - Got worse off of OCPs  Initiation of OCP (BCP) - hormonal rx for acne   Need for prophylactic vaccination and inoculation against influenza Counseled regarding healthy nutrition, exercise, sleep, injury prevention, calcium vit d and healthy weight . I suspect her acne got worse off of hormonal suppression would add doxycycline twice a day for a couple weeks then daily topical Differin at night and resume oral contraceptive suppression. Discussed potential interaction with antibiotics and decrease efficacy of OCPs. Continue to use  condoms. Followup in 2-3 months if you're not improving consider getting dermatology involved. She would like and significant improvement before  the time of her wedding in May. -Patient advised to return or notify health care team  if symptoms worsen or persist or new concerns arise. Declined sti screen is lower risk at this time Patient Instructions  Begin ocps again as this can help the acne  Add antibiotic and  Evening Differin pea sized amount.   Wash in am   ROV in 2-3 months or as needed.     Acne Acne is a skin problem that causes pimples. Acne occurs when the pores in your skin get  blocked. Your pores may become red, sore, and swollen (inflamed), or infected with a common skin bacterium (Propionibacterium acnes). Acne is a common skin problem. Up to 80% of people get acne at some time. Acne is especially common from the ages of 47 to 38. Acne usually goes away over time with proper treatment. CAUSES  Your pores each contain an oil gland. The oil glands make an oily substance called sebum. Acne happens when these glands get plugged with sebum, dead skin cells, and dirt. The P. acnes bacteria that are normally found in the oil glands then multiply, causing inflammation. Acne is commonly triggered by changes in your hormones. These hormonal changes can cause the oil glands to get bigger and to make more sebum. Factors that can make acne worse include:  Hormone changes during adolescence.  Hormone changes during women's menstrual cycles.  Hormone changes during pregnancy.  Oil-based cosmetics and hair products.  Harshly scrubbing the skin.  Strong soaps.  Stress.  Hormone problems due to certain diseases.  Long or oily hair rubbing against the skin.  Certain medicines.  Pressure from headbands, backpacks, or shoulder pads.  Exposure to certain oils and chemicals. SYMPTOMS  Acne often occurs on the face, neck, chest, and upper back. Symptoms include:  Small, red bumps  (pimples or papules).  Whiteheads (closed comedones).  Blackheads (open comedones).  Small, pus-filled pimples (pustules).  Big, red pimples or pustules that feel tender. More severe acne can cause:  An infected area that contains a collection of pus (abscess).  Hard, painful, fluid-filled sacs (cysts).  Scars. DIAGNOSIS  Your caregiver can usually tell what the problem is by doing a physical exam. TREATMENT  There are many good treatments for acne. Some are available over-the-counter and some are available with a prescription. The treatment that is best for you depends on the type of acne you have and how severe it is. It may take 2 months of treatment before your acne gets better. Common treatments include:  Creams and lotions that prevent oil glands from clogging.  Creams and lotions that treat or prevent infections and inflammation.  Antibiotics applied to the skin or taken as a pill.  Pills that decrease sebum production.  Birth control pills.  Light or laser treatments.  Minor surgery.  Injections of medicine into the affected areas.  Chemicals that cause peeling of the skin. HOME CARE INSTRUCTIONS  Good skin care is the most important part of treatment.  Wash your skin gently at least twice a day and after exercise. Always wash your skin before bed.  Use mild soap.  After each wash, apply a water-based skin moisturizer.  Keep your hair clean and off of your face. Shampoo your hair daily.  Only take medicines as directed by your caregiver.  Use a sunscreen or sunblock with SPF 30 or greater. This is especially important when you are using acne medicines.  Choose cosmetics that are noncomedogenic. This means they do not plug the oil glands.  Avoid leaning your chin or forehead on your hands.  Avoid wearing tight headbands or hats.  Avoid picking or squeezing your pimples. This can make your acne worse and cause scarring. SEEK MEDICAL CARE IF:   Your  acne is not better after 8 weeks.  Your acne gets worse.  You have a large area of skin that is red or tender. Document Released: 10/13/2000 Document Revised: 01/08/2012 Document Reviewed: 08/04/2011 [facility] Patient Information 2014 [place], [place].  Preventive Care for Adults, Female  A healthy lifestyle and preventive care can promote health and wellness. Preventive health guidelines for women include the following key practices.  A routine yearly physical is a good way to check with your caregiver about your health and preventive screening. It is a chance to share any concerns and updates on your health, and to receive a thorough exam.  Visit your dentist for a routine exam and preventive care every 6 months. Brush your teeth twice a day and floss once a day. Good oral hygiene prevents tooth decay and gum disease.  The frequency of eye exams is based on your age, health, family medical history, use of contact lenses, and other factors. Follow your caregiver's recommendations for frequency of eye exams.  Eat a healthy diet. Foods like vegetables, fruits, whole grains, low-fat dairy products, and lean protein foods contain the nutrients you need without too many calories. Decrease your intake of foods high in solid fats, added sugars, and salt. Eat the right amount of calories for you.Get information about a proper diet from your caregiver, if necessary.  Regular physical exercise is one of the most important things you can do for your health. Most adults should get at least 150 minutes of moderate-intensity exercise (any activity that increases your heart rate and causes you to sweat) each week. In addition, most adults need muscle-strengthening exercises on 2 or more days a week.  Maintain a healthy weight. The body mass index (BMI) is a screening tool to identify possible weight problems. It provides an estimate of body fat based on height and weight. Your caregiver can help determine your  BMI, and can help you achieve or maintain a healthy weight.For adults 20 years and older:  A BMI below 18.5 is considered underweight.  A BMI of 18.5 to 24.9 is normal.  A BMI of 25 to 29.9 is considered overweight.  A BMI of 30 and above is considered obese.  Maintain normal blood lipids and cholesterol levels by exercising and minimizing your intake of saturated fat. Eat a balanced diet with plenty of fruit and vegetables. Blood tests for lipids and cholesterol should begin at age 40 and be repeated every 5 years. If your lipid or cholesterol levels are high, you are over 50, or you are at high risk for heart disease, you may need your cholesterol levels checked more frequently.Ongoing high lipid and cholesterol levels should be treated with medicines if diet and exercise are not effective.  If you smoke, find out from your caregiver how to quit. If you do not use tobacco, do not start.  If you are pregnant, do not drink alcohol. If you are breastfeeding, be very cautious about drinking alcohol. If you are not pregnant and choose to drink alcohol, do not exceed 1 drink per day. One drink is considered to be 12 ounces (355 mL) of beer, 5 ounces (148 mL) of wine, or 1.5 ounces (44 mL) of liquor.  Avoid use of street drugs. Do not share needles with anyone. Ask for help if you need support or instructions about stopping the use of drugs.  High blood pressure causes heart disease and increases the risk of stroke. Your blood pressure should be checked at least every 1 to 2 years. Ongoing high blood pressure should be treated with medicines if weight loss and exercise are not effective.  If you are 31 to 22 years old, ask your caregiver if you should take aspirin to prevent strokes.  Diabetes screening involves taking a blood  sample to check your fasting blood sugar level. This should be done once every 3 years, after age 30, if you are within normal weight and without risk factors for diabetes.  Testing should be considered at a younger age or be carried out more frequently if you are overweight and have at least 1 risk factor for diabetes.  Breast cancer screening is essential preventive care for women. You should practice "breast self-awareness." This means understanding the normal appearance and feel of your breasts and may include breast self-examination. Any changes detected, no matter how small, should be reported to a caregiver. Women in their 21s and 30s should have a clinical breast exam (CBE) by a caregiver as part of a regular health exam every 1 to 3 years. After age 42, women should have a CBE every year. Starting at age 27, women should consider having a mammography (breast X-ray test) every year. Women who have a family history of breast cancer should talk to their caregiver about genetic screening. Women at a high risk of breast cancer should talk to their caregivers about having magnetic resonance imaging (MRI) and a mammography every year.  The Pap test is a screening test for cervical cancer. A Pap test can show cell changes on the cervix that might become cervical cancer if left untreated. A Pap test is a procedure in which cells are obtained and examined from the lower end of the uterus (cervix).  Women should have a Pap test starting at age 54.  Between ages 4 and 32, Pap tests should be repeated every 2 years.  Beginning at age 28, you should have a Pap test every 3 years as long as the past 3 Pap tests have been normal.  Some women have medical problems that increase the chance of getting cervical cancer. Talk to your caregiver about these problems. It is especially important to talk to your caregiver if a new problem develops soon after your last Pap test. In these cases, your caregiver may recommend more frequent screening and Pap tests.  The above recommendations are the same for women who have or have not gotten the vaccine for human papillomavirus (HPV).  If you  had a hysterectomy for a problem that was not cancer or a condition that could lead to cancer, then you no longer need Pap tests. Even if you no longer need a Pap test, a regular exam is a good idea to make sure no other problems are starting.  If you are between ages 32 and 71, and you have had normal Pap tests going back 10 years, you no longer need Pap tests. Even if you no longer need a Pap test, a regular exam is a good idea to make sure no other problems are starting.  If you have had past treatment for cervical cancer or a condition that could lead to cancer, you need Pap tests and screening for cancer for at least 20 years after your treatment.  If Pap tests have been discontinued, risk factors (such as a new sexual partner) need to be reassessed to determine if screening should be resumed.  The HPV test is an additional test that may be used for cervical cancer screening. The HPV test looks for the virus that can cause the cell changes on the cervix. The cells collected during the Pap test can be tested for HPV. The HPV test could be used to screen women aged 2 years and older, and should be used in women of  any age who have unclear Pap test results. After the age of 34, women should have HPV testing at the same frequency as a Pap test.  Colorectal cancer can be detected and often prevented. Most routine colorectal cancer screening begins at the age of 25 and continues through age 43. However, your caregiver may recommend screening at an earlier age if you have risk factors for colon cancer. On a yearly basis, your caregiver may provide home test kits to check for hidden blood in the stool. Use of a small camera at the end of a tube, to directly examine the colon (sigmoidoscopy or colonoscopy), can detect the earliest forms of colorectal cancer. Talk to your caregiver about this at age 60, when routine screening begins. Direct examination of the colon should be repeated every 5 to 10 years  through age 5, unless early forms of pre-cancerous polyps or small growths are found.  Hepatitis C blood testing is recommended for all people born from [birth year] through 1965 and any individual with known risks for hepatitis C.  Practice safe sex. Use condoms and avoid high-risk sexual practices to reduce the spread of sexually transmitted infections (STIs). STIs include gonorrhea, chlamydia, syphilis, trichomonas, herpes, HPV, and human immunodeficiency virus (HIV). Herpes, HIV, and HPV are viral illnesses that have no cure. They can result in disability, cancer, and death. Sexually active women aged 41 and younger should be checked for chlamydia. Older women with new or multiple partners should also be tested for chlamydia. Testing for other STIs is recommended if you are sexually active and at increased risk.  Osteoporosis is a disease in which the bones lose minerals and strength with aging. This can result in serious bone fractures. The risk of osteoporosis can be identified using a bone density scan. Women ages 58 and over and women at risk for fractures or osteoporosis should discuss screening with their caregivers. Ask your caregiver whether you should take a calcium supplement or vitamin D to reduce the rate of osteoporosis.  Menopause can be associated with physical symptoms and risks. Hormone replacement therapy is available to decrease symptoms and risks. You should talk to your caregiver about whether hormone replacement therapy is right for you.  Use sunscreen with sun protection factor (SPF) of 30 or more. Apply sunscreen liberally and repeatedly throughout the day. You should seek shade when your shadow is shorter than you. Protect yourself by wearing long sleeves, pants, a wide-brimmed hat, and sunglasses year round, whenever you are outdoors.  Once a month, do a whole body skin exam, using a mirror to look at the skin on your back. Notify your caregiver of new moles, moles that have  irregular borders, moles that are larger than a pencil eraser, or moles that have changed in shape or color.  Stay current with required immunizations.  Influenza. You need a dose every fall (or winter). The composition of the flu vaccine changes each year, so being vaccinated once is not enough.  Pneumococcal polysaccharide. You need 1 to 2 doses if you smoke cigarettes or if you have certain chronic medical conditions. You need 1 dose at age 58 (or older) if you have never been vaccinated.  Tetanus, diphtheria, pertussis (Tdap, Td). Get 1 dose of Tdap vaccine if you are younger than age 37, are over 26 and have contact with an infant, are a Research scientist (physical sciences), are pregnant, or simply want to be protected from whooping cough. After that, you need a Td booster dose every 10 years.  Consult your caregiver if you have not had at least 3 tetanus and diphtheria-containing shots sometime in your life or have a deep or dirty wound.  HPV. You need this vaccine if you are a woman age 23 or younger. The vaccine is given in 3 doses over 6 months.  Measles, mumps, rubella (MMR). You need at least 1 dose of MMR if you were born in 1957 or later. You may also need a second dose.  Meningococcal. If you are age 32 to 60 and a first-year college student living in a residence hall, or have one of several medical conditions, you need to get vaccinated against meningococcal disease. You may also need additional booster doses.  Zoster (shingles). If you are age 32 or older, you should get this vaccine.  Varicella (chickenpox). If you have never had chickenpox or you were vaccinated but received only 1 dose, talk to your caregiver to find out if you need this vaccine.  Hepatitis A. You need this vaccine if you have a specific risk factor for hepatitis A virus infection or you simply wish to be protected from this disease. The vaccine is usually given as 2 doses, 6 to 18 months apart.  Hepatitis B. You need this  vaccine if you have a specific risk factor for hepatitis B virus infection or you simply wish to be protected from this disease. The vaccine is given in 3 doses, usually over 6 months.       Neta Mends. Delora Gravatt M.D. Marland Kitchen

## 2013-10-25 ENCOUNTER — Other Ambulatory Visit: Payer: Self-pay | Admitting: Internal Medicine

## 2013-11-03 ENCOUNTER — Ambulatory Visit: Payer: Federal, State, Local not specified - PPO | Admitting: Internal Medicine

## 2013-11-05 ENCOUNTER — Encounter: Payer: Self-pay | Admitting: Internal Medicine

## 2013-11-05 ENCOUNTER — Ambulatory Visit (INDEPENDENT_AMBULATORY_CARE_PROVIDER_SITE_OTHER): Payer: Federal, State, Local not specified - PPO | Admitting: Internal Medicine

## 2013-11-05 VITALS — BP 118/84 | Temp 98.3°F | Wt 192.0 lb

## 2013-11-05 DIAGNOSIS — L708 Other acne: Secondary | ICD-10-CM

## 2013-11-05 DIAGNOSIS — Z23 Encounter for immunization: Secondary | ICD-10-CM

## 2013-11-05 DIAGNOSIS — L709 Acne, unspecified: Secondary | ICD-10-CM

## 2013-11-05 MED ORDER — ADAPALENE-BENZOYL PEROXIDE 0.1-2.5 % EX GEL: 1.0000 "application " | Freq: Every day | CUTANEOUS | Status: DC

## 2013-11-05 MED ORDER — MINOCYCLINE HCL 50 MG PO CAPS: 50.0000 mg | ORAL_CAPSULE | Freq: Two times a day (BID) | ORAL | Status: DC

## 2013-11-05 MED ORDER — ADAPALENE 0.1 % EX CREA: TOPICAL_CREAM | Freq: Every day | CUTANEOUS | Status: DC
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
## 2013-11-05 NOTE — Patient Instructions (Addendum)
Change to epiduo  Topical every day ( instead of differein)   changing type of antibiotic  To minocycline  Consider change ocps  ROV in 2 months

## 2013-12-09 ENCOUNTER — Encounter: Payer: Self-pay | Admitting: Family Medicine

## 2013-12-09 ENCOUNTER — Ambulatory Visit (INDEPENDENT_AMBULATORY_CARE_PROVIDER_SITE_OTHER): Payer: Federal, State, Local not specified - PPO | Admitting: Family Medicine

## 2013-12-09 VITALS — BP 126/84 | Temp 99.4°F | Ht 64.5 in | Wt 196.0 lb

## 2013-12-09 DIAGNOSIS — R300 Dysuria: Secondary | ICD-10-CM

## 2013-12-09 LAB — POCT URINALYSIS DIPSTICK
Bilirubin, UA: NEGATIVE
Glucose, UA: NEGATIVE
Ketones, UA: NEGATIVE
Nitrite, UA: NEGATIVE
PH UA: 7
PROTEIN UA: NEGATIVE
RBC UA: NEGATIVE
SPEC GRAV UA: 1.01
UROBILINOGEN UA: 0.2
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
## 2013-12-09 NOTE — Progress Notes (Signed)
Pre visit review using our clinic review tool, if applicable. No additional management support is needed unless otherwise documented below in the visit note. 

## 2013-12-11 LAB — URINE CULTURE
COLONY COUNT: NO GROWTH
Organism ID, Bacteria: NO GROWTH

## 2014-08-28 ENCOUNTER — Other Ambulatory Visit: Payer: Self-pay | Admitting: Internal Medicine

## 2014-08-30 NOTE — Telephone Encounter (Signed)
Last visit 1 15   Refill x 3 months  Have her schedule Yearly visit or CPX to be completed by January 16 for further refills

## 2014-08-31 ENCOUNTER — Other Ambulatory Visit: Payer: Self-pay | Admitting: Family Medicine

## 2014-08-31 ENCOUNTER — Telehealth: Payer: Self-pay | Admitting: Family Medicine

## 2014-08-31 DIAGNOSIS — Z0000 Encounter for general adult medical examination without abnormal findings: Secondary | ICD-10-CM

## 2014-08-31 NOTE — Telephone Encounter (Signed)
Sent to the pharmacy for 3 months.  Will send a message to scheduling to get CPX.

## 2014-08-31 NOTE — Telephone Encounter (Signed)
Pt [facility] Jan [facility]  Please schedule with the pt. I have placed the order for lab work. Thanks!

## 2014-09-01 NOTE — Telephone Encounter (Signed)
Pt will call back

## 2014-10-25 ENCOUNTER — Telehealth: Payer: Self-pay | Admitting: Physician Assistant

## 2014-10-25 ENCOUNTER — Ambulatory Visit (INDEPENDENT_AMBULATORY_CARE_PROVIDER_SITE_OTHER): Payer: Federal, State, Local not specified - PPO | Admitting: Family Medicine

## 2014-10-25 VITALS — BP 120/82 | HR 124 | Temp 99.5°F | Resp 18 | Ht 65.0 in | Wt 186.8 lb

## 2014-10-25 DIAGNOSIS — D72829 Elevated white blood cell count, unspecified: Secondary | ICD-10-CM

## 2014-10-25 DIAGNOSIS — R110 Nausea: Secondary | ICD-10-CM

## 2014-10-25 DIAGNOSIS — A0811 Acute gastroenteropathy due to Norwalk agent: Secondary | ICD-10-CM

## 2014-10-25 DIAGNOSIS — R1084 Generalized abdominal pain: Secondary | ICD-10-CM

## 2014-10-25 DIAGNOSIS — R000 Tachycardia, unspecified: Secondary | ICD-10-CM

## 2014-10-25 DIAGNOSIS — R52 Pain, unspecified: Secondary | ICD-10-CM

## 2014-10-25 LAB — COMPREHENSIVE METABOLIC PANEL
ALT: 8 U/L (ref 0–35)
AST: 10 U/L (ref 0–37)
Albumin: 3.9 g/dL (ref 3.5–5.2)
Alkaline Phosphatase: 35 U/L — ABNORMAL LOW (ref 39–117)
BILIRUBIN TOTAL: 0.6 mg/dL (ref 0.2–1.2)
BUN: 13 mg/dL (ref 6–23)
CHLORIDE: 103 meq/L (ref 96–112)
CO2: 21 mEq/L (ref 19–32)
Calcium: 8.9 mg/dL (ref 8.4–10.5)
Creat: 0.57 mg/dL (ref 0.50–1.10)
Glucose, Bld: 99 mg/dL (ref 70–99)
Potassium: 3.6 mEq/L (ref 3.5–5.3)
Sodium: 136 mEq/L (ref 135–145)
Total Protein: 6.5 g/dL (ref 6.0–8.3)

## 2014-10-25 LAB — POCT URINALYSIS DIPSTICK
Bilirubin, UA: NEGATIVE
Blood, UA: NEGATIVE
Glucose, UA: NEGATIVE
Ketones, UA: NEGATIVE
LEUKOCYTES UA: NEGATIVE
Nitrite, UA: NEGATIVE
Protein, UA: NEGATIVE
Spec Grav, UA: 1.015
Urobilinogen, UA: 0.2
pH, UA: 6.5

## 2014-10-25 LAB — POCT CBC
GRANULOCYTE PERCENT: 89.1 % — AB (ref 37–80)
HCT, POC: 43.7 % (ref 37.7–47.9)
Hemoglobin: 14.4 g/dL (ref 12.2–16.2)
Lymph, poc: 0.9 (ref 0.6–3.4)
MCH, POC: 30.1 pg (ref 27–31.2)
MCHC: 32.9 g/dL (ref 31.8–35.4)
MCV: 91.3 fL (ref 80–97)
MID (CBC): 0.7 (ref 0–0.9)
MPV: 7.6 fL (ref 0–99.8)
POC GRANULOCYTE: 12.7 — AB (ref 2–6.9)
POC LYMPH %: 6.3 % — AB (ref 10–50)
POC MID %: 4.6 %M (ref 0–12)
Platelet Count, POC: 246 10*3/uL (ref 142–424)
RBC: 4.79 M/uL (ref 4.04–5.48)
RDW, POC: 12.3 %
WBC: 14.2 10*3/uL — AB (ref 4.6–10.2)

## 2014-10-25 LAB — POCT URINE PREGNANCY: PREG TEST UR: NEGATIVE

## 2014-10-25 MED ORDER — ONDANSETRON 4 MG PO TBDP
8.0000 mg | ORAL_TABLET | Freq: Once | ORAL | Status: AC
  Administered 2014-10-25: 8 mg via ORAL

## 2014-10-25 MED ORDER — RANITIDINE HCL 150 MG PO TABS: 150.0000 mg | ORAL_TABLET | Freq: Two times a day (BID) | ORAL | Status: DC

## 2014-10-25 MED ORDER — ONDANSETRON 8 MG PO TBDP: 8.0000 mg | ORAL_TABLET | Freq: Three times a day (TID) | ORAL | Status: DC | PRN

## 2014-10-25 MED ORDER — TRAMADOL HCL 50 MG PO TABS: 50.0000 mg | ORAL_TABLET | Freq: Three times a day (TID) | ORAL | Status: DC | PRN

## 2014-10-25 MED ORDER — GI COCKTAIL ~~LOC~~: 10.0000 mL | Freq: Three times a day (TID) | ORAL | Status: DC | PRN

## 2014-10-25 MED ORDER — GI COCKTAIL ~~LOC~~
10.0000 mL | Freq: Once | ORAL | Status: AC
  Administered 2014-10-25: 10 mL via ORAL
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
## 2014-10-25 NOTE — Telephone Encounter (Signed)
Pharmacist called to clarify order for GI cocktail. Advised to use their house magic mouthwash mixed with lidocaine.

## 2014-11-22 ENCOUNTER — Other Ambulatory Visit: Payer: Self-pay | Admitting: Internal Medicine

## 2014-11-23 NOTE — Telephone Encounter (Signed)
Denied.  Pt was to schedule an appt.  Never called back.  See refill note from 08/28/14

## 2015-04-09 ENCOUNTER — Telehealth: Payer: Self-pay | Admitting: Internal Medicine

## 2015-04-09 NOTE — Telephone Encounter (Signed)
Left a message for the pt to return my call.  

## 2015-04-09 NOTE — Telephone Encounter (Signed)
Pt would like a call back about something she saw on her insurance file. She said the file is a showing a HIV diagnosis from Dr Fabian Sharp office in 2009.

## 2015-04-12 NOTE — Telephone Encounter (Signed)
LM for the pt to return my call. 

## 2015-04-13 NOTE — Telephone Encounter (Signed)
LM on listed number.  Asked if it was okay to leave a detailed message on her machine if I am unable to pick up when she returns my call.

## 2015-04-13 NOTE — Telephone Encounter (Signed)
Spoke to the pt and advised that the chart is not showing a positive dx of HIV.  Does show a negative HIV screen in 2009.  Pt reassured that her chart does not show positive HIV.

## 2015-06-02 ENCOUNTER — Other Ambulatory Visit: Payer: Self-pay | Admitting: Obstetrics & Gynecology

## 2015-06-03 LAB — CYTOLOGY - PAP

## 2016-03-01 ENCOUNTER — Ambulatory Visit (INDEPENDENT_AMBULATORY_CARE_PROVIDER_SITE_OTHER): Payer: BC Managed Care – PPO

## 2016-03-01 ENCOUNTER — Encounter: Payer: Self-pay | Admitting: Family Medicine

## 2016-03-01 ENCOUNTER — Ambulatory Visit (INDEPENDENT_AMBULATORY_CARE_PROVIDER_SITE_OTHER): Payer: BC Managed Care – PPO | Admitting: Family Medicine

## 2016-03-01 VITALS — BP 116/72 | HR 96 | Temp 98.2°F | Resp 18 | Ht 65.0 in | Wt 211.0 lb

## 2016-03-01 DIAGNOSIS — M79644 Pain in right finger(s): Secondary | ICD-10-CM | POA: Diagnosis not present

## 2016-03-01 DIAGNOSIS — S63619A Unspecified sprain of unspecified finger, initial encounter: Secondary | ICD-10-CM

## 2016-03-01 MED ORDER — PREDNISONE 20 MG PO TABS: ORAL_TABLET | ORAL | Status: DC

## 2016-03-01 NOTE — Progress Notes (Signed)
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 She remembers waking one week ago with some pain in her right long finger at the PIP joint. The pain is been persistent and is throbbing. It seemed to getting worse and there is some associated swelling of the PIP joint. She's not seeing any black and blue and the skin has not been broken.  [name] is allergic to sulfonamide derivatives. She  reports that she has never smoked. She does not have any smokeless tobacco history on file. She reports that she does not drink alcohol or use illicit drugs. She  has no sexual activity history on file.  The patient  has past surgical history that includes Myringotomy and Tympanostomy tube placement. Her family history includes Cancer in her maternal grandfather; Diabetes in her paternal grandmother; Hyperlipidemia in her paternal grandmother; Hypertension in her paternal grandmother; Thyroid disease in an other family member.  Objective: Young adult woman in no acute distress BP 116/72 mmHg  Pulse 96  Temp(Src) 98.2 F (36.8 C) (Oral)  Resp 18  Ht 5\' 5"  (1.651 m)  Wt 211 lb (95.709 kg)  BMI 35.11 kg/m2  SpO2 99%  LMP 02/17/2016 Examination of the right middle finger reveals moderate swelling of the PIP joint and inability to flex greater than 90. There is no ecchymosis there is some tenderness with palpation of the PIP joint.  -rays of the hand appears to be normal  Pain in finger of right hand - Plan: DG Finger Middle Right, predniSONE (DELTASONE) 20 MG tablet  Sprain of finger, initial encounter - Plan: predniSONE (DELTASONE) 20 MG tablet  Buddy tape   Return if symptoms not resolved in 5 days  Elvina SidleKurt Konner Saiz, MD

## 2016-03-01 NOTE — Patient Instructions (Addendum)
This should resolve by Monday.  Otherwise, return to clinic    IF you received an x-ray today, you will receive an invoice from [facility]. Please contact [facility] at [phone] with questions or concerns regarding your invoice.   IF you received labwork today, you will receive an invoice from [facility]. Please contact [facility] at [phone] with questions or concerns regarding your invoice.   Our billing staff will not be able to assist you with questions regarding bills from these companies.  You will be contacted with the lab results as soon as they are available. The fastest way to get your results is to activate your My Chart account. Instructions are located on the last page of this paperwork. If you have not heard from us regarding the results in 2 weeks, please contact this office.

## 2016-07-06 LAB — OB RESULTS CONSOLE HIV ANTIBODY (ROUTINE TESTING): HIV: NONREACTIVE

## 2016-07-06 LAB — OB RESULTS CONSOLE RPR: RPR: NONREACTIVE

## 2016-07-06 LAB — OB RESULTS CONSOLE RUBELLA ANTIBODY, IGM: RUBELLA: IMMUNE

## 2016-07-06 LAB — OB RESULTS CONSOLE GC/CHLAMYDIA
CHLAMYDIA, DNA PROBE: NEGATIVE
Gonorrhea: NEGATIVE

## 2016-07-06 LAB — OB RESULTS CONSOLE ANTIBODY SCREEN: ANTIBODY SCREEN: NEGATIVE

## 2016-07-06 LAB — OB RESULTS CONSOLE ABO/RH: RH Type: POSITIVE

## 2016-07-06 LAB — OB RESULTS CONSOLE HEPATITIS B SURFACE ANTIGEN: HEP B S AG: NEGATIVE

## 2017-02-08 ENCOUNTER — Encounter (HOSPITAL_COMMUNITY): Payer: Self-pay | Admitting: *Deleted

## 2017-02-08 LAB — OB RESULTS CONSOLE GBS: GBS: POSITIVE
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
## 2017-02-09 ENCOUNTER — Encounter (HOSPITAL_COMMUNITY)
Admission: RE | Admit: 2017-02-09 | Discharge: 2017-02-09 | Disposition: A | Discharge status: Home / Self Care | Payer: BC Managed Care – PPO | Source: Ambulatory Visit | Attending: Obstetrics & Gynecology | Admitting: Obstetrics & Gynecology

## 2017-02-09 HISTORY — DX: Headache: R51

## 2017-02-09 HISTORY — DX: Anxiety disorder, unspecified: F41.9

## 2017-02-09 HISTORY — DX: Headache, unspecified: R51.9

## 2017-02-09 HISTORY — DX: Personal history of other infectious and parasitic diseases: Z86.19

## 2017-02-09 LAB — CBC
HCT: 38.9 % (ref 36.0–46.0)
Hemoglobin: 13.4 g/dL (ref 12.0–15.0)
MCH: 29.4 pg (ref 26.0–34.0)
MCHC: 34.4 g/dL (ref 30.0–36.0)
MCV: 85.3 fL (ref 78.0–100.0)
PLATELETS: 180 10*3/uL (ref 150–400)
RBC: 4.56 MIL/uL (ref 3.87–5.11)
RDW: 13.6 % (ref 11.5–15.5)
WBC: 9.2 10*3/uL (ref 4.0–10.5)

## 2017-02-09 LAB — TYPE AND SCREEN
ABO/RH(D): O POS
Antibody Screen: NEGATIVE

## 2017-02-09 LAB — ABO/RH: ABO/RH(D): O POS

## 2017-02-09 NOTE — Patient Instructions (Signed)
20 SHANTY GINTY  02/09/2017   Your procedure is scheduled on:  02/10/2017  Enter through the Main Entrance of [facility] at 0800 AM.  Pick up the phone at the desk and dial [phone].   Call this number if you have problems the morning of surgery: [phone]   Remember:   Do not eat food:After Midnight.  Do not drink clear liquids: After Midnight.  Take these medicines the morning of surgery with A SIP OF WATER: none   Do not wear jewelry, make-up or nail polish.  Do not wear lotions, powders, or perfumes. Do not wear deodorant.  Do not shave 48 hours prior to surgery.  Do not bring valuables to the hospital.  [facility] is not   responsible for any belongings or valuables brought to the hospital.  Contacts, dentures or bridgework may not be worn into surgery.  Leave suitcase in the car. After surgery it may be brought to your room.  For patients admitted to the hospital, checkout time is 11:00 AM the day of              discharge.   Patients discharged the day of surgery will not be allowed to drive             home.  Name and phone number of your driver: na  Special Instructions:   N/A   Please read over the following fact sheets that you were given:   Surgical Site Infection Prevention

## 2017-02-10 ENCOUNTER — Encounter (HOSPITAL_COMMUNITY): Payer: Self-pay | Admitting: *Deleted

## 2017-02-10 ENCOUNTER — Inpatient Hospital Stay (HOSPITAL_COMMUNITY): Payer: BC Managed Care – PPO | Admitting: Anesthesiology

## 2017-02-10 ENCOUNTER — Inpatient Hospital Stay (HOSPITAL_COMMUNITY)
Admission: AD | Admit: 2017-02-10 | Discharge: 2017-02-14 | DRG: 766 | Disposition: A | Discharge status: Home / Self Care | Payer: BC Managed Care – PPO | Source: Ambulatory Visit | Attending: Obstetrics and Gynecology | Admitting: Obstetrics and Gynecology

## 2017-02-10 ENCOUNTER — Encounter (HOSPITAL_COMMUNITY)
Admission: AD | Disposition: A | Discharge status: Home / Self Care | Payer: Self-pay | Source: Ambulatory Visit | Attending: Obstetrics and Gynecology

## 2017-02-10 DIAGNOSIS — O3663X0 Maternal care for excessive fetal growth, third trimester, not applicable or unspecified: Principal | ICD-10-CM | POA: Diagnosis present

## 2017-02-10 DIAGNOSIS — Z8249 Family history of ischemic heart disease and other diseases of the circulatory system: Secondary | ICD-10-CM

## 2017-02-10 DIAGNOSIS — Z833 Family history of diabetes mellitus: Secondary | ICD-10-CM

## 2017-02-10 DIAGNOSIS — R030 Elevated blood-pressure reading, without diagnosis of hypertension: Secondary | ICD-10-CM | POA: Diagnosis not present

## 2017-02-10 DIAGNOSIS — O99824 Streptococcus B carrier state complicating childbirth: Secondary | ICD-10-CM | POA: Diagnosis present

## 2017-02-10 DIAGNOSIS — O9089 Other complications of the puerperium, not elsewhere classified: Secondary | ICD-10-CM | POA: Diagnosis not present

## 2017-02-10 DIAGNOSIS — Z3A40 40 weeks gestation of pregnancy: Secondary | ICD-10-CM | POA: Diagnosis not present

## 2017-02-10 LAB — RPR: RPR Ser Ql: NONREACTIVE

## 2017-02-10 SURGERY — Surgical Case
Anesthesia: Spinal

## 2017-02-10 MED ORDER — MORPHINE SULFATE (PF) 10 MG/ML IV SOLN
INTRAVENOUS | Status: DC | PRN
  Administered 2017-02-10: .1 mL via BUCCAL

## 2017-02-10 MED ORDER — ONDANSETRON HCL 4 MG/2ML IJ SOLN
INTRAMUSCULAR | Status: AC
  Filled 2017-02-10: qty 2

## 2017-02-10 MED ORDER — KETOROLAC TROMETHAMINE 30 MG/ML IJ SOLN: 30.0000 mg | Freq: Four times a day (QID) | INTRAMUSCULAR | Status: AC | PRN

## 2017-02-10 MED ORDER — ACETAMINOPHEN 325 MG PO TABS
650.0000 mg | ORAL_TABLET | ORAL | Status: DC | PRN
  Administered 2017-02-12 (×2): 650 mg via ORAL
  Filled 2017-02-10 (×2): qty 2

## 2017-02-10 MED ORDER — NALOXONE HCL 0.4 MG/ML IJ SOLN: 0.4000 mg | INTRAMUSCULAR | Status: DC | PRN

## 2017-02-10 MED ORDER — FENTANYL CITRATE (PF) 100 MCG/2ML IJ SOLN
INTRAMUSCULAR | Status: AC
  Filled 2017-02-10: qty 2

## 2017-02-10 MED ORDER — SOD CITRATE-CITRIC ACID 500-334 MG/5ML PO SOLN: 30.0000 mL | ORAL | Status: DC

## 2017-02-10 MED ORDER — COCONUT OIL OIL
1.0000 "application " | TOPICAL_OIL | Status: DC | PRN
  Administered 2017-02-10: 1 via TOPICAL
  Filled 2017-02-10: qty 120

## 2017-02-10 MED ORDER — DIPHENHYDRAMINE HCL 50 MG/ML IJ SOLN
12.5000 mg | INTRAMUSCULAR | Status: DC | PRN
  Administered 2017-02-10: 12.5 mg via INTRAVENOUS
  Filled 2017-02-10: qty 1

## 2017-02-10 MED ORDER — OXYCODONE HCL 5 MG PO TABS
5.0000 mg | ORAL_TABLET | ORAL | Status: DC | PRN
  Administered 2017-02-10 – 2017-02-14 (×7): 5 mg via ORAL
  Filled 2017-02-10 (×6): qty 1

## 2017-02-10 MED ORDER — LACTATED RINGERS IV SOLN
INTRAVENOUS | Status: DC | PRN
  Administered 2017-02-10: 40 [IU] via INTRAVENOUS

## 2017-02-10 MED ORDER — DIPHENHYDRAMINE HCL 25 MG PO CAPS: 25.0000 mg | ORAL_CAPSULE | Freq: Four times a day (QID) | ORAL | Status: DC | PRN

## 2017-02-10 MED ORDER — MORPHINE SULFATE (PF) 0.5 MG/ML IJ SOLN
INTRAMUSCULAR | Status: DC | PRN
  Administered 2017-02-10: 50 ug via INTRATHECAL

## 2017-02-10 MED ORDER — NALBUPHINE HCL 10 MG/ML IJ SOLN: 5.0000 mg | INTRAMUSCULAR | Status: DC | PRN

## 2017-02-10 MED ORDER — LACTATED RINGERS IV SOLN
INTRAVENOUS | Status: DC
  Administered 2017-02-11: 03:00:00 via INTRAVENOUS

## 2017-02-10 MED ORDER — SCOPOLAMINE 1 MG/3DAYS TD PT72: 1.0000 | MEDICATED_PATCH | Freq: Once | TRANSDERMAL | Status: DC

## 2017-02-10 MED ORDER — CEFAZOLIN SODIUM-DEXTROSE 2-4 GM/100ML-% IV SOLN
2.0000 g | INTRAVENOUS | Status: AC
  Administered 2017-02-10: 2 g via INTRAVENOUS
  Filled 2017-02-10: qty 100

## 2017-02-10 MED ORDER — ZOLPIDEM TARTRATE 5 MG PO TABS: 5.0000 mg | ORAL_TABLET | Freq: Every evening | ORAL | Status: DC | PRN

## 2017-02-10 MED ORDER — SIMETHICONE 80 MG PO CHEW
80.0000 mg | CHEWABLE_TABLET | Freq: Three times a day (TID) | ORAL | Status: DC
  Administered 2017-02-10 – 2017-02-14 (×10): 80 mg via ORAL
  Filled 2017-02-10 (×9): qty 1

## 2017-02-10 MED ORDER — BUPIVACAINE HCL (PF) 0.5 % IJ SOLN
INTRAMUSCULAR | Status: AC
  Filled 2017-02-10: qty 30

## 2017-02-10 MED ORDER — OXYTOCIN 40 UNITS IN LACTATED RINGERS INFUSION - SIMPLE MED: 2.5000 [IU]/h | INTRAVENOUS | Status: AC

## 2017-02-10 MED ORDER — SCOPOLAMINE 1 MG/3DAYS TD PT72
MEDICATED_PATCH | TRANSDERMAL | Status: DC | PRN
  Administered 2017-02-10: 1 via TRANSDERMAL

## 2017-02-10 MED ORDER — KETOROLAC TROMETHAMINE 30 MG/ML IJ SOLN
INTRAMUSCULAR | Status: AC
  Filled 2017-02-10: qty 1

## 2017-02-10 MED ORDER — MENTHOL 3 MG MT LOZG: 1.0000 | LOZENGE | OROMUCOSAL | Status: DC | PRN

## 2017-02-10 MED ORDER — OXYTOCIN 10 UNIT/ML IJ SOLN
INTRAMUSCULAR | Status: AC
  Filled 2017-02-10: qty 4

## 2017-02-10 MED ORDER — MORPHINE SULFATE (PF) 0.5 MG/ML IJ SOLN
INTRAMUSCULAR | Status: AC
  Filled 2017-02-10: qty 10

## 2017-02-10 MED ORDER — ONDANSETRON HCL 4 MG/2ML IJ SOLN
INTRAMUSCULAR | Status: DC | PRN
  Administered 2017-02-10: 4 mg via INTRAVENOUS

## 2017-02-10 MED ORDER — SIMETHICONE 80 MG PO CHEW: 80.0000 mg | CHEWABLE_TABLET | ORAL | Status: DC | PRN

## 2017-02-10 MED ORDER — VITAMIN K1 1 MG/0.5ML IJ SOLN
INTRAMUSCULAR | Status: AC
  Filled 2017-02-10: qty 0.5

## 2017-02-10 MED ORDER — LACTATED RINGERS IV SOLN
INTRAVENOUS | Status: DC | PRN
  Administered 2017-02-10 (×2): via INTRAVENOUS

## 2017-02-10 MED ORDER — IBUPROFEN 600 MG PO TABS
600.0000 mg | ORAL_TABLET | Freq: Four times a day (QID) | ORAL | Status: DC
  Administered 2017-02-10 – 2017-02-14 (×15): 600 mg via ORAL
  Filled 2017-02-10 (×15): qty 1

## 2017-02-10 MED ORDER — SIMETHICONE 80 MG PO CHEW
80.0000 mg | CHEWABLE_TABLET | ORAL | Status: DC
  Administered 2017-02-10 – 2017-02-13 (×4): 80 mg via ORAL
  Filled 2017-02-10 (×4): qty 1

## 2017-02-10 MED ORDER — KETOROLAC TROMETHAMINE 30 MG/ML IJ SOLN
30.0000 mg | Freq: Once | INTRAMUSCULAR | Status: AC
  Administered 2017-02-10: 30 mg via INTRAMUSCULAR

## 2017-02-10 MED ORDER — NALOXONE HCL 2 MG/2ML IJ SOSY
1.0000 ug/kg/h | PREFILLED_SYRINGE | INTRAVENOUS | Status: DC | PRN
  Filled 2017-02-10: qty 2

## 2017-02-10 MED ORDER — PHENYLEPHRINE 8 MG IN D5W 100 ML (0.08MG/ML) PREMIX OPTIME
INJECTION | INTRAVENOUS | Status: AC
  Filled 2017-02-10: qty 100

## 2017-02-10 MED ORDER — LACTATED RINGERS IV SOLN
INTRAVENOUS | Status: DC | PRN
  Administered 2017-02-10: 11:00:00 via INTRAVENOUS

## 2017-02-10 MED ORDER — SODIUM CHLORIDE 0.9% FLUSH
3.0000 mL | INTRAVENOUS | Status: DC | PRN
  Administered 2017-02-12: 3 mL via INTRAVENOUS
  Filled 2017-02-10: qty 3

## 2017-02-10 MED ORDER — DIPHENHYDRAMINE HCL 25 MG PO CAPS
25.0000 mg | ORAL_CAPSULE | ORAL | Status: DC | PRN
  Filled 2017-02-10: qty 1

## 2017-02-10 MED ORDER — WITCH HAZEL-GLYCERIN EX PADS: 1.0000 "application " | MEDICATED_PAD | CUTANEOUS | Status: DC | PRN

## 2017-02-10 MED ORDER — ERYTHROMYCIN 5 MG/GM OP OINT
TOPICAL_OINTMENT | OPHTHALMIC | Status: AC
  Filled 2017-02-10: qty 1

## 2017-02-10 MED ORDER — OXYCODONE HCL 5 MG PO TABS
10.0000 mg | ORAL_TABLET | ORAL | Status: DC | PRN
  Administered 2017-02-10 – 2017-02-12 (×3): 10 mg via ORAL
  Filled 2017-02-10 (×4): qty 2

## 2017-02-10 MED ORDER — PHENYLEPHRINE 8 MG IN D5W 100 ML (0.08MG/ML) PREMIX OPTIME
INJECTION | INTRAVENOUS | Status: DC | PRN
  Administered 2017-02-10: 60 ug/min via INTRAVENOUS

## 2017-02-10 MED ORDER — DIBUCAINE 1 % RE OINT: 1.0000 "application " | TOPICAL_OINTMENT | RECTAL | Status: DC | PRN

## 2017-02-10 MED ORDER — NALBUPHINE HCL 10 MG/ML IJ SOLN: 5.0000 mg | Freq: Once | INTRAMUSCULAR | Status: DC | PRN

## 2017-02-10 MED ORDER — TETANUS-DIPHTH-ACELL PERTUSSIS 5-2.5-18.5 LF-MCG/0.5 IM SUSP: 0.5000 mL | Freq: Once | INTRAMUSCULAR | Status: DC

## 2017-02-10 MED ORDER — ONDANSETRON HCL 4 MG/2ML IJ SOLN: 4.0000 mg | Freq: Three times a day (TID) | INTRAMUSCULAR | Status: DC | PRN

## 2017-02-10 MED ORDER — SOD CITRATE-CITRIC ACID 500-334 MG/5ML PO SOLN
ORAL | Status: AC
Start: 2017-02-10 — End: 2017-02-10
  Administered 2017-02-10: 30 mL
  Filled 2017-02-10: qty 15

## 2017-02-10 MED ORDER — FENTANYL CITRATE (PF) 100 MCG/2ML IJ SOLN
INTRAMUSCULAR | Status: DC | PRN
  Administered 2017-02-10: 50 ug via INTRATHECAL

## 2017-02-10 MED ORDER — SENNOSIDES-DOCUSATE SODIUM 8.6-50 MG PO TABS
2.0000 | ORAL_TABLET | ORAL | Status: DC
  Administered 2017-02-10 – 2017-02-13 (×4): 2 via ORAL
  Filled 2017-02-10 (×4): qty 2

## 2017-02-10 MED ORDER — BUPIVACAINE IN DEXTROSE 0.75-8.25 % IT SOLN
INTRATHECAL | Status: AC
  Filled 2017-02-10: qty 2

## 2017-02-10 MED ORDER — CHLOROPROCAINE HCL (PF) 3 % IJ SOLN: INTRAMUSCULAR | Status: DC | PRN

## 2017-02-10 MED ORDER — PRENATAL MULTIVITAMIN CH
1.0000 | ORAL_TABLET | Freq: Every day | ORAL | Status: DC
  Administered 2017-02-11 – 2017-02-14 (×4): 1 via ORAL
  Filled 2017-02-10 (×3): qty 1

## 2017-02-10 SURGICAL SUPPLY — 40 items
ADH SKN CLS APL DERMABOND .7 (GAUZE/BANDAGES/DRESSINGS) ×1
ADH SKN CLS LQ APL DERMABOND (GAUZE/BANDAGES/DRESSINGS) ×1
APL SKNCLS STERI-STRIP NONHPOA (GAUZE/BANDAGES/DRESSINGS) ×1
BENZOIN TINCTURE PRP APPL 2/3 (GAUZE/BANDAGES/DRESSINGS) ×1 IMPLANT
CHLORAPREP W/TINT 26ML (MISCELLANEOUS) ×2 IMPLANT
CLAMP CORD UMBIL (MISCELLANEOUS) IMPLANT
CLOSURE STERI STRIP 1/2 X4 (GAUZE/BANDAGES/DRESSINGS) ×1 IMPLANT
CLOTH BEACON ORANGE TIMEOUT ST (SAFETY) ×2 IMPLANT
DERMABOND ADHESIVE PROPEN (GAUZE/BANDAGES/DRESSINGS) ×1
DERMABOND ADVANCED (GAUZE/BANDAGES/DRESSINGS) ×1
DERMABOND ADVANCED .7 DNX12 (GAUZE/BANDAGES/DRESSINGS) ×1 IMPLANT
DERMABOND ADVANCED .7 DNX6 (GAUZE/BANDAGES/DRESSINGS) IMPLANT
DRSG OPSITE POSTOP 4X10 (GAUZE/BANDAGES/DRESSINGS) ×2 IMPLANT
ELECT REM PT RETURN 9FT ADLT (ELECTROSURGICAL) ×2
ELECTRODE REM PT RTRN 9FT ADLT (ELECTROSURGICAL) ×1 IMPLANT
EXTRACTOR VACUUM KIWI (MISCELLANEOUS) IMPLANT
GLOVE BIO SURGEON STRL SZ 6.5 (GLOVE) ×2 IMPLANT
GLOVE BIOGEL PI IND STRL 6.5 (GLOVE) ×1 IMPLANT
GLOVE BIOGEL PI IND STRL 7.0 (GLOVE) ×2 IMPLANT
GLOVE BIOGEL PI INDICATOR 6.5 (GLOVE) ×1
GLOVE BIOGEL PI INDICATOR 7.0 (GLOVE) ×2
GOWN STRL REUS W/TWL LRG LVL3 (GOWN DISPOSABLE) ×4 IMPLANT
KIT ABG SYR 3ML LUER SLIP (SYRINGE) ×2 IMPLANT
NDL HYPO 25X5/8 SAFETYGLIDE (NEEDLE) ×1 IMPLANT
NEEDLE HYPO 25X5/8 SAFETYGLIDE (NEEDLE) ×2 IMPLANT
NS IRRIG 1000ML POUR BTL (IV SOLUTION) ×2 IMPLANT
PACK C SECTION WH (CUSTOM PROCEDURE TRAY) ×2 IMPLANT
PAD OB MATERNITY 4.3X12.25 (PERSONAL CARE ITEMS) ×2 IMPLANT
PENCIL SMOKE EVAC W/HOLSTER (ELECTROSURGICAL) ×2 IMPLANT
RETRACTOR WND ALEXIS 25 LRG (MISCELLANEOUS) IMPLANT
RTRCTR WOUND ALEXIS 25CM LRG (MISCELLANEOUS)
SUT PLAIN 0 NONE (SUTURE) IMPLANT
SUT PLAIN 2 0 (SUTURE) ×4
SUT PLAIN ABS 2-0 CT1 27XMFL (SUTURE) ×1 IMPLANT
SUT VIC AB 0 CT1 36 (SUTURE) ×2 IMPLANT
SUT VIC AB 0 CTX 36 (SUTURE) ×4
SUT VIC AB 0 CTX36XBRD ANBCTRL (SUTURE) ×2 IMPLANT
SUT VIC AB 4-0 KS 27 (SUTURE) IMPLANT
TOWEL OR 17X24 6PK STRL BLUE (TOWEL DISPOSABLE) ×2 IMPLANT
TRAY FOLEY BAG SILVER LF 14FR (SET/KITS/TRAYS/PACK) IMPLANT

## 2017-02-10 NOTE — Transfer of Care (Signed)
Immediate Anesthesia Transfer of Care Note  Patient: Alison Wagner  Procedure(s) Performed: Procedure(s) with comments: CESAREAN SECTION (N/A) - Primary edc 02/10/17 allergy to sulfa RNFA if available  Patient Location: PACU  Anesthesia Type:Spinal  Level of Consciousness: awake, alert  and oriented  Airway & Oxygen Therapy: Patient Spontanous Breathing  Post-op Assessment: Report given to RN and Post -op Vital signs reviewed and stable  Post vital signs: Reviewed and stable  Last Vitals:  Vitals:   02/10/17 0841  BP: (!) 140/93  Pulse: 85  Resp: 18  Temp: 36.6 C    Last Pain:  Vitals:   02/10/17 0841  TempSrc: Oral         Complications: No apparent anesthesia complications

## 2017-02-10 NOTE — Anesthesia Preprocedure Evaluation (Signed)
Anesthesia Evaluation  Patient identified by MRN, date of birth, ID band Patient awake    Airway Mallampati: III  TM Distance: >3 FB Neck ROM: full    Dental no notable dental hx.    Pulmonary neg pulmonary ROS,    Pulmonary exam normal        Cardiovascular negative cardio ROS Normal cardiovascular exam     Neuro/Psych  Headaches, neg Seizures PSYCHIATRIC DISORDERS    GI/Hepatic negative GI ROS, Neg liver ROS,   Endo/Other  negative endocrine ROS  Renal/GU negative Renal ROS  negative genitourinary   Musculoskeletal negative musculoskeletal ROS (+)   Abdominal   Peds  Hematology negative hematology ROS (+)   Anesthesia Other Findings   Reproductive/Obstetrics (+) Pregnancy                             Anesthesia Physical Anesthesia Plan  ASA: II  Anesthesia Plan: Spinal   Post-op Pain Management:    Induction:   Airway Management Planned: Mask  Additional Equipment: None  Intra-op Plan:   Post-operative Plan:   Informed Consent: I have reviewed the patients History and Physical, chart, labs and discussed the procedure including the risks, benefits and alternatives for the proposed anesthesia with the patient or authorized representative who has indicated his/her understanding and acceptance.     Plan Discussed with: CRNA  Anesthesia Plan Comments: ( By signing, I attest that I have identified and re-evaluated the patient immediately before the induction of anesthesia and I am satisfied that my anesthetic plan is suitable for the patient's condition and procedure.  The first vital signs recorded are pre-induction.)        Anesthesia Quick Evaluation

## 2017-02-10 NOTE — Op Note (Signed)
PROCEDURE DATE: 02/10/2017  PREOPERATIVE DIAGNOSIS: Intrauterine pregnancy at 40.0 wga, Indication: suspect macrosomnia  POSTOPERATIVE DIAGNOSIS:The same  PROCEDURE: primaryLow TransverseCesarean Section  SURGEON: Dr. Belva Agee  INDICATIONS:This is a 26yo G1P0 at 40.0 wga presenting for scheduled cesarean section secondary to suspected macrosomia. The risks of cesarean section discussed with the patient included but were not limited to: bleeding which may require transfusion or reoperation; infection which may require antibiotics; injury to bowel, bladder, ureters or other surrounding organs; injury to the fetus; need for additional procedures including hysterectomy in the event of a life-threatening hemorrhage; placental abnormalities wth subsequent pregnancies, incisional problems, thromboembolic phenomenon and other postoperative/anesthesia complications. The patient agreed with the proposed plan, giving informed consent for the procedure.   FINDINGS: Viable maleinfant in vertex presentation,APGARs pending, Weight pending, Amniotic fluid clear, Intact placenta, three vessel cord. Grossly normal uterus, ovaries and fallopian tubes. .  ANESTHESIA: Epidural ESTIMATED BLOOD LOSS: 700cc SPECIMENS: Placenta for L&D COMPLICATIONS: None immediate  PROCEDURE IN DETAIL: The patient received intravenous antibiotics (2g Ancef) and had sequential compression devices applied to her lower extremities while in the preoperative area. Shewasthen taken to the operating roomwhere epidural anesthesiawas dosed up to surgical level andwas found to be adequate. She was then placed in a dorsal supine position with a leftward tilt,and prepped and draped in a sterile manner.A foley catheter was placed into her bladder and attached to constant gravity. After an adequate timeout was performed, aPfannenstiel skin incision was made with scalpel and carried through to the underlying  layer of fascia. The fascia was incised in the midline and this incision was extended bilaterally using the Mayo scissors. Kocher clamps were applied to the superior aspect of the fascial incision and the underlying rectus muscles were dissected off bluntly. A similar process was carried out on the inferior aspect of the facial incision. The rectus muscles were separated in the midline bluntly and the peritoneum was entered bluntly. A bladder flap was created sharply and developed bluntly.Atransverse hysterotomy was made with a scalpel and extended bilaterally bluntly. The bladder blade was then removed. The infant was successfully delivered, and cord was clamped and cut and infant was handed over to awaiting neonatology team. Uterine massage was then administered and the placenta delivered intact with three-vessel cord. Cord gases were taken. The uterus was cleared of clot and debris. The hysterotomy was closed with 0 vicryl.A second imbricating suture of 0-vicryl was used to reinforce the incision and aid in hemostasis.The fascia was closed with 0-Vicryl in a running fashion with good restoration of anatomy. The subcutaneus tissue was irrigated and was reapproximated using three interrupted plain gut stitches. The skin was closed with 4-0 Vicryl in a subcuticular fashion.  Final EBL was 700cc (all surgical site and was hemostatic at end of procedure) without any further bleeding on exam.   Pt tolerated the procedure well. All sponge/lap/needle counts were correct X 2. Pt taken to recovery room in stable condition. Baby boy "Dareen Piano" to NICU.    Belva Agee MD

## 2017-02-10 NOTE — Brief Op Note (Signed)
02/10/2017  11:06 AM  PATIENT:  Alison Wagner  26 y.o. female  PRE-OPERATIVE DIAGNOSIS:  suspected macrosomia  POST-OPERATIVE DIAGNOSIS:  primary cesarean for macrosomia  PROCEDURE:  Procedure(s) with comments: CESAREAN SECTION (N/A) - Primary edc 02/10/17 allergy to sulfa RNFA if available  SURGEON:  Surgeon(s) and Role:    * Ranae Pila, MD - Primary  PHYSICIAN ASSISTANT:   ASSISTANTS: none   ANESTHESIA:   spinal  EBL:  Total I/O In: 2000 [I.V.:2000] Out: 900 [Urine:200; Blood:700]  BLOOD ADMINISTERED:none  DRAINS: Urinary Catheter (Foley)   LOCAL MEDICATIONS USED:  NONE  SPECIMEN:  Source of Specimen:  placenta  DISPOSITION OF SPECIMEN:  L&D  COUNTS:  YES  TOURNIQUET:  * No tourniquets in log *  DICTATION: .Note written in EPIC  PLAN OF CARE: Admit to inpatient   PATIENT DISPOSITION:  PACU - hemodynamically stable.   Delay start of Pharmacological VTE agent (>24hrs) due to surgical blood loss or risk of bleeding: not applicable

## 2017-02-10 NOTE — Anesthesia Procedure Notes (Signed)
Spinal  Patient location during procedure: OR Start time: 02/10/2017 10:02 AM Staffing Anesthesiologist: Odette Fraction Performed: anesthesiologist  Preanesthetic Checklist Completed: patient identified, site marked, surgical consent, pre-op evaluation, timeout performed, IV checked, risks and benefits discussed and monitors and equipment checked Spinal Block Patient position: sitting Patient monitoring: heart rate, continuous pulse ox and blood pressure Approach: midline Location: L3-4 Needle Needle type: Pencan  Needle gauge: 25 G Needle length: 5 cm Assessment Sensory level: T8 Additional Notes I was present and participated in the regional anesthetic procedure key events.  By signing, I attest that I have identified and re-evaluated the patient immediately before the induction of anesthesia and I am satisfied that my anesthetic plan is suitable for the patient's condition and procedure.

## 2017-02-10 NOTE — Anesthesia Postprocedure Evaluation (Signed)
Anesthesia Post Note  Patient: Alison Wagner  Procedure(s) Performed: Procedure(s) (LRB): CESAREAN SECTION (N/A)  Patient location during evaluation: PACU Anesthesia Type: Spinal Level of consciousness: awake and alert, oriented and patient cooperative Pain management: pain level controlled Vital Signs Assessment: post-procedure vital signs reviewed and stable Respiratory status: spontaneous breathing Cardiovascular status: blood pressure returned to baseline Postop Assessment: no headache, no signs of nausea or vomiting, no backache and spinal receding Anesthetic complications: no        Last Vitals:  Vitals:   02/10/17 1115 02/10/17 1130  BP: 134/83 (!) 134/93  Pulse: 88 70  Resp: (!) 22 12  Temp: 36.4 C     Last Pain:  Vitals:   02/10/17 1130  TempSrc:   PainSc: 0-No pain   Pain Goal:                 [name]

## 2017-02-10 NOTE — Lactation Note (Signed)
This note was copied from a baby's chart. Lactation Consultation Note  Patient Name: Alison Wagner ZOXWR'U Date: 02/10/2017 Reason for consult: Initial assessment;NICU baby Baby in NICU, RN to set up DEBP for Mom to start pumping when able. Mom uncomfortable at this time. Advised Mom to pump every 3 hours for 15 minutes followed by 5 minutes of hand expression to encourage milk production. NICU booklet and LC brochure given to Mom for review. Yellow dots given to Mom with instructions. Discussed storage guidelines. Mom to call for questions/concerns.   Maternal Data Has patient been taught Hand Expression?: Yes Does the patient have breastfeeding experience prior to this delivery?: No  Feeding    LATCH Score/Interventions                      Lactation Tools Discussed/Used     Consult Status Consult Status: Follow-up Date: 02/11/17 Follow-up type: In-patient    Alfred Levins 02/10/2017, 4:37 PM

## 2017-02-11 ENCOUNTER — Encounter (HOSPITAL_COMMUNITY): Payer: Self-pay | Admitting: Obstetrics and Gynecology

## 2017-02-11 LAB — CBC
HEMATOCRIT: 30.5 % — AB (ref 36.0–46.0)
Hemoglobin: 10.5 g/dL — ABNORMAL LOW (ref 12.0–15.0)
MCH: 29.5 pg (ref 26.0–34.0)
MCHC: 34.4 g/dL (ref 30.0–36.0)
MCV: 85.7 fL (ref 78.0–100.0)
Platelets: 144 10*3/uL — ABNORMAL LOW (ref 150–400)
RBC: 3.56 MIL/uL — ABNORMAL LOW (ref 3.87–5.11)
RDW: 13.8 % (ref 11.5–15.5)
WBC: 8.7 10*3/uL (ref 4.0–10.5)

## 2017-02-11 NOTE — Anesthesia Postprocedure Evaluation (Signed)
Anesthesia Post Note  Patient: Alison Wagner  Procedure(s) Performed: Procedure(s) (LRB): CESAREAN SECTION (N/A)  Patient location during evaluation: [facility] Anesthesia Type: Spinal Level of consciousness: awake, awake and alert, oriented and patient cooperative Pain management: pain level controlled Vital Signs Assessment: post-procedure vital signs reviewed and stable Respiratory status: spontaneous breathing, nonlabored ventilation and respiratory function stable Cardiovascular status: stable Postop Assessment: no headache, no backache, no signs of nausea or vomiting and patient able to bend at knees Anesthetic complications: no        Last Vitals:  Vitals:   02/11/17 0021 02/11/17 0358  BP: 120/75 132/83  Pulse: 71 62  Resp: 18 15  Temp: 36.7 C 36.6 C    Last Pain:  Vitals:   02/11/17 0540  TempSrc:   PainSc: 2    Pain Goal: Patients Stated Pain Goal: 3 (02/11/17 0540)               Dennie Moltz L

## 2017-02-11 NOTE — Progress Notes (Signed)
S: No complaints. Feeling well. Pain c/w PO pain meds. Lochia appropriate. No subjective fevers/chills, Cp, or SOB. Ambulating.   O:  Vitals:   02/11/17 0358 02/11/17 0757  BP: 132/83 124/79  Pulse: 62 78  Resp: 15 16  Temp: 97.9 F (36.6 C) 98.1 F (36.7 C)    Gen: NAD, A&O Pulm: NWOB Abd: soft, appropriately ttp, fundus firm and below Umb. Incision c/d/i Ext: No evidence of DVT, trace edema b/l  UOP adequate.   Labs CBC    Component Value Date/Time   WBC 8.7 02/11/2017 0524   RBC 3.56 (L) 02/11/2017 0524   HGB 10.5 (L) 02/11/2017 0524   HCT 30.5 (L) 02/11/2017 0524   PLT 144 (L) 02/11/2017 0524   MCV 85.7 02/11/2017 0524   MCV 91.3 10/25/2014 1459   MCH 29.5 02/11/2017 0524   MCHC 34.4 02/11/2017 0524   RDW 13.8 02/11/2017 0524   LYMPHSABS 2.1 04/09/2012 1304   MONOABS 0.3 04/09/2012 1304   EOSABS 0.1 04/09/2012 1304   BASOSABS 0.0 04/09/2012 1304    A/P:  POD1 s/p pLTCS for suspected macrosomia, doing well pp. AFVSS. Benign exam. No other sig med/surg issues.  Continue present care.  Baby boy in the NICU - not ready for circ.  Plan for d/c POD#2 or 3.   Belva Agee MD

## 2017-02-11 NOTE — Addendum Note (Signed)
Addendum  created 02/11/17 0755 by Yolonda Kida, CRNA   Sign clinical note

## 2017-02-12 NOTE — Progress Notes (Signed)
CSW acknowledges NICU admission.    Patient screened out for psychosocial assessment since none of the following apply:  Psychosocial stressors documented in mother or baby's chart  Gestation less than 32 weeks  Code at delivery   Infant with anomalies  Please contact the Clinical Social Worker if specific needs arise, or by MOB's request.   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

## 2017-02-12 NOTE — Lactation Note (Signed)
This note was copied from a baby's chart. Lactation Consultation Note  Patient Name: [name] ZOXWR'U Date: 02/12/2017   NICU baby 89 hours old. Assisted mom with pumping and demonstrated the initiation setting. Assisted mom with hand expression with colostrum flowing. Mom able to hand express 1-2 ml of colostrum. Fitted mom with #20 NS and demonstrated how to pre-fill with EBM/formula to enc baby to suckle at the breast. Enc mom to call for assistance with use of NS and sizing while baby suckling. Enc mom to pump ever 2-3 hours for a total of at least 8 times/24 hours followed by hand expression. Discussed progression of milk coming to volume. Mom has a personal Spectra pump and enc to compare to Medela. Mom also pumping at baby's bedside in NICU with hospital pump.   Maternal Data    Feeding Feeding Type: Formula Nipple Type: Regular Length of feed: 15 min  LATCH Score/Interventions                      Lactation Tools Discussed/Used     Consult Status      Alison Wagner 02/12/2017, 2:46 PM

## 2017-02-12 NOTE — Lactation Note (Addendum)
This note was copied from a baby's chart. Lactation Consultation Note  Patient Name: Alison Wagner [id number] Date: 02/12/2017 Reason for consult: Initial assessment;NICU baby  NICU baby 32 hours old. Mom reports that baby has been sleepy at the breast. Mom is able to express with colostrum flowing, and EBM given to baby, but baby remains sleepy and not willing to suckle this LC's gloved finger. Enc mom to offer STS at this feeding, and then call for this [facility] after she eats her lunch. Will assist mom with hand expression and pumping--mom enc to call for assistance, and will attempt to latch at next feeding.   Maternal Data    Feeding Feeding Type: Breast Fed Nipple Type: Slow - flow Length of feed: 0 min  LATCH Score/Interventions Latch: Too sleepy or reluctant, no latch achieved, no sucking elicited. Intervention(s): Skin to skin;Waking techniques Intervention(s): Breast compression;Assist with latch;Adjust position  Audible Swallowing: None  Type of Nipple: Flat  Comfort (Breast/Nipple): Soft / non-tender     Hold (Positioning): Assistance needed to correctly position infant at breast and maintain latch. Intervention(s): Breastfeeding basics reviewed;Support Pillows;Position options;Skin to skin  LATCH Score: 4  Lactation Tools Discussed/Used Pump Review: Setup, frequency, and cleaning;Milk Storage Initiated by:: Bedside RN Date initiated:: 02/10/17   Consult Status Consult Status: Follow-up Date: 02/12/17 Follow-up type: In-patient    Sherlyn Hay 02/12/2017, 11:02 AM

## 2017-02-12 NOTE — Progress Notes (Signed)
Subjective: Postpartum Day 2: Cesarean Delivery Patient reports incisional pain, tolerating PO and no problems voiding.   Baby in NICU , off of respiratory support, breastfeeding Objective: Vital signs in last 24 hours: Temp:  [97.8 F (36.6 C)-98.4 F (36.9 C)] 97.8 F (36.6 C) (04/16 1216) Pulse Rate:  [67-99] 67 (04/16 1216) Resp:  [18-20] 20 (04/16 1216) BP: (131-136)/(76-88) 131/76 (04/16 1216) SpO2:  [96 %-100 %] 100 % (04/16 1216)  Physical Exam:  General: alert and cooperative Lochia: appropriate Uterine Fundus: firm Incision: healing well DVT Evaluation: No evidence of DVT seen on physical exam. Negative Homan's sign. No cords or calf tenderness. Calf/Ankle edema is present.   Recent Labs  02/11/17 0524  HGB 10.5*  HCT 30.5*    Assessment/Plan: Status post Cesarean section. Doing well postoperatively.  Continue current care Anticipates, baby discharge Wednesday.  Cassie Shedlock G 02/12/2017, 1:04 PM

## 2017-02-13 LAB — COMPREHENSIVE METABOLIC PANEL
ALBUMIN: 2.6 g/dL — AB (ref 3.5–5.0)
ALT: 17 U/L (ref 14–54)
ANION GAP: 7 (ref 5–15)
AST: 22 U/L (ref 15–41)
Alkaline Phosphatase: 90 U/L (ref 38–126)
BILIRUBIN TOTAL: 0.6 mg/dL (ref 0.3–1.2)
BUN: 7 mg/dL (ref 6–20)
CO2: 24 mmol/L (ref 22–32)
Calcium: 8.1 mg/dL — ABNORMAL LOW (ref 8.9–10.3)
Chloride: 104 mmol/L (ref 101–111)
Creatinine, Ser: 0.56 mg/dL (ref 0.44–1.00)
GFR calc Af Amer: >60 mL/min (ref >60)
GFR calc non Af Amer: >60 mL/min (ref >60)
GLUCOSE: 98 mg/dL (ref 65–99)
POTASSIUM: 3.6 mmol/L (ref 3.5–5.1)
SODIUM: 135 mmol/L (ref 135–145)
TOTAL PROTEIN: 5.7 g/dL — AB (ref 6.5–8.1)

## 2017-02-13 LAB — CBC
HCT: 31 % — ABNORMAL LOW (ref 36.0–46.0)
Hemoglobin: 10.3 g/dL — ABNORMAL LOW (ref 12.0–15.0)
MCH: 28.6 pg (ref 26.0–34.0)
MCHC: 33.2 g/dL (ref 30.0–36.0)
MCV: 86.1 fL (ref 78.0–100.0)
PLATELETS: 205 10*3/uL (ref 150–400)
RBC: 3.6 MIL/uL — ABNORMAL LOW (ref 3.87–5.11)
RDW: 13.6 % (ref 11.5–15.5)
WBC: 7.4 10*3/uL (ref 4.0–10.5)

## 2017-02-13 NOTE — Care Management Note (Signed)
Case Management Note  Patient Details  Name: Alison Wagner MRN: [id number] Date of Birth: 1991/08/04  Additional Comments: 3:30pm -   (Pt's RN and myself met with the Pt and her Husband in her room - 26) Case Manager notified by Pt's Nurse of NICU discharge today and Pt wanting to stay another night.  The Pt and Husband had verified with insurance co that they could stay another night based on the fact that she had a c/section on 02/10/17. Case Manager verified with NICU Charge Nurse that infant was discharged to the care of the Pt / Husband and "not rooming in". Case Manager then spoke with Dr. Helane Rima (Pt's covering MD today) and explained that infant was dc'd to the care of the Pt / Husband - they would be responsible for all infant care - diapers, formula, wipes etc and they would need to call 911 if infant needed additional care.  They would not be able to take the infant back to the NICU, [employer] or 3rd floor Nursing Staff.  Parents aware of this and actually stated this before CM could explain it to them.   Dr. Helane Rima is fine with Pt staying another night as long as they follow the hospital protocol regarding care of infant.  Pt / Husband have elected to stay the 4th night (4/17).  CM left voice mail for Dr. Helane Rima stating that Pt would be remaining in house tonight with planned dc on 02/14/17. Pt / Husband stated that Pt had been up ambulating and are aware of the importance of this to avoid GI issues - ie ileus.  Pt is pumping and stated that they were having to use formula until milk comes in.  FOB was planing to take as much as possible home (Pt's belongings/flowers etc.) today and get supplies and car seat for the infant. Parents very pleasant and appreciative of our (RN/CM) interaction and information. CM available to assist as needed.  Dicie Beam [place], [place] 02/13/2017, 5:34 PM

## 2017-02-13 NOTE — Lactation Note (Signed)
This note was copied from a baby's chart. Lactation Consultation Note  Patient Name: Alison Wagner ZOXWR'U Date: 02/13/2017 Reason for consult: Follow-up assessment  Baby 80 hours old, rooming in with mom. Assisted mom to feed baby at the breast with EBM and formula using #20 NS and 5 [place] feeding system. Baby tolerated well. Discussed with parents that NS a bridge to having baby directly at breast, and reviewed some methods of moving away from its use. Mom aware of OP/BFSG and LC phone line assistance after D/C. Enc parents to use NS and feeding system when baby cueing to eat. Enc MOB to pump after each feeding followed by hand expression.   Maternal Data    Feeding Feeding Type: Formula Nipple Type: Regular Length of feed: 10 min  LATCH Score/Interventions                      Lactation Tools Discussed/Used Tools: 9F feeding tube / Syringe Nipple shield size: 20   Consult Status      Sherlyn Hay 02/13/2017, 7:01 PM

## 2017-02-13 NOTE — Progress Notes (Signed)
Subjective: Postpartum Day 3: Cesarean Delivery Patient reports tolerating PO, + flatus and no problems voiding.   Denies HA, blurred vision, or RUQ pain Objective: Vital signs in last 24 hours: Temp:  [97.5 F (36.4 C)-98.6 F (37 C)] 97.6 F (36.4 C) (04/17 0400) Pulse Rate:  [67-88] 88 (04/17 0815) Resp:  [18-20] 20 (04/17 0815) BP: (128-146)/(76-92) 128/90 (04/17 0815) SpO2:  [100 %] 100 % (04/17 0815)  Physical Exam:  General: alert and cooperative Lochia: appropriate Uterine Fundus: firm Incision: healing well DVT Evaluation: No evidence of DVT seen on physical exam. Negative Homan's sign. No cords or calf tenderness. Calf/Ankle edema is present. DTR's 1+   Recent Labs  02/11/17 0524  HGB 10.5*  HCT 30.5*    Assessment/Plan: Status post Cesarean section. Postoperative course complicated by elevated BP  CBC and CMP  Baby stable.  Despina Boan G 02/13/2017, 8:48 AM

## 2017-02-14 MED ORDER — IBUPROFEN 600 MG PO TABS: 600.0000 mg | ORAL_TABLET | Freq: Four times a day (QID) | ORAL | 1 refills | Status: DC

## 2017-02-14 MED ORDER — OXYCODONE HCL 5 MG PO TABS: 5.0000 mg | ORAL_TABLET | ORAL | 0 refills | Status: DC | PRN

## 2017-02-14 NOTE — Progress Notes (Signed)
Discharge instructions given, questions answered, states understanding, signed and given copy. 

## 2017-02-14 NOTE — Discharge Summary (Signed)
Obstetric Discharge Summary Reason for Admission: cesarean section Prenatal Procedures: ultrasound Intrapartum Procedures: cesarean: low cervical, transverse Postpartum Procedures: none Complications-Operative and Postpartum: none Hemoglobin  Date Value Ref Range Status  02/13/2017 10.3 (L) 12.0 - 15.0 g/dL Final   HCT  Date Value Ref Range Status  02/13/2017 31.0 (L) 36.0 - 46.0 % Final    Physical Exam:  General: alert and cooperative Lochia: appropriate Uterine Fundus: firm Incision: healing well DVT Evaluation: No evidence of DVT seen on physical exam. Negative Homan's sign. No cords or calf tenderness. Calf/Ankle edema is present.  Discharge Diagnoses: Term Pregnancy-delivered  Discharge Information: Date: 02/14/2017 Activity: pelvic rest Diet: routine Medications: PNV, Ibuprofen and Percocet Condition: stable Instructions: refer to practice specific booklet Discharge to: home   Newborn Data: Live born female  Birth Weight: 9 lb 14 oz (4480 g) APGAR: 8, 8  Home with mother.  Alison Wagner G 02/14/2017, 8:22 AM

## 2017-02-14 NOTE — Lactation Note (Signed)
Lactation Consultation Note  Patient Name: LAKEDRA WASHINGTON [id number] Date: 02/14/2017   Mom called for assistance with latching the baby at breast. Mom able to latch baby to right breast in football position and baby willing to nurse for about 5 minutes. Mom reports that she is seeing EBM flow from right breast, but not much from left. Assisted mom with hand expression, and milk flowing from left breast as well.   Assisted parents with use of SNS system and baby able to latch to breast and be supplemented without NS. Plan is for mom to set SNS system up prior to latching baby, and to nurse with cues. Enc mom to latch baby and allow baby to nurse, if willing, then start the SNS flowing. Enc mom to post-pump and hand express after baby at breast. Mom aware of OP/BFSG and LC phone line assistance after D/C.   Maternal Data    Feeding    LATCH Score/Interventions                      Lactation Tools Discussed/Used     Consult Status      Sherlyn Hay 02/14/2017, 10:55 AM

## 2017-02-14 NOTE — Progress Notes (Signed)
Case Management: 9:30a -  Case Manager followed up with Pt / Husband in room 322 this am to see how things were going with the infant.  Per Parent's infant did not sleep well and they are tired but have help who came in while this CM was visiting.  Parents very appreciative of this CM's visit.  CM gave them card with name and number and Father gave this CM his card as well - Cato Mulligan  [employer]  [phone]. CM spoke with AD - Asher Muir - and gave report from yesterday and today - Parents seem very pleased and appreciative.  CM available to assist as needed.  Roseanne Reno  RNBSN   [phone]

## 2017-02-14 NOTE — Discharge Instructions (Signed)
Cesarean Delivery °Cesarean birth, or cesarean delivery, is the surgical delivery of a baby through an incision in the abdomen and the uterus. This may be referred to as a C-section. This procedure may be scheduled ahead of time, or it may be done in an emergency situation. °Tell a health care provider about: °· Any allergies you have. °· All medicines you are taking, including vitamins, herbs, eye drops, creams, and over-the-counter medicines. °· Any problems you or family members have had with anesthetic medicines. °· Any blood disorders you have. °· Any surgeries you have had. °· Any medical conditions you have. °· Whether you or any members of your family have a history of deep vein thrombosis (DVT) or pulmonary embolism (PE). °What are the risks? °Generally, this is a safe procedure. However, problems may occur, including: °· Infection. °· Bleeding. °· Allergic reactions to medicines. °· Damage to other structures or organs. °· Blood clots. °· Injury to your baby. ° °What happens before the procedure? °· Follow instructions from your health care provider about eating or drinking restrictions. °· Follow instructions from your health care provider about bathing before your procedure to help reduce your risk of infection. °· If you know that you are going to have a cesarean delivery, do not shave your pubic area. Shaving before the procedure may increase your risk of infection. °· Ask your health care provider about: °? Changing or stopping your regular medicines. This is especially important if you are taking diabetes medicines or blood thinners. °? Your pain management plan. This is especially important if you plan to breastfeed your baby. °? How long you will be in the hospital after the procedure. °? Any concerns you may have about receiving blood products if you need them during the procedure. °? Cord blood banking, if you plan to collect your baby’s umbilical cord blood. °· You may also want to ask your  health care provider: °? Whether you will be able to hold or breastfeed your baby while you are still in the operating room. °? Whether your baby can stay with you immediately after the procedure and during your recovery. °? Whether a family member or a person of your choice can go with you into the operating room and stay with you during the procedure, immediately after the procedure, and during your recovery. °· Plan to have someone drive you home when you are discharged from the hospital. °What happens during the procedure? °· Fetal monitors will be placed on your abdomen to monitor your heart rate and your baby's heart rate. °· Depending on the reason for your cesarean delivery, you may have a physical exam or additional testing, such as an ultrasound. °· An IV tube will be inserted into one of your veins. °· You may have your blood or urine tested. °· You will be given antibiotic medicine to help prevent infection. °· You may be given a special warming gown to wear to keep your temperature stable. °· Hair may be removed from your pubic area. °· The skin of your pubic area and lower abdomen will be cleaned with a germ-killing solution (antiseptic). °· A catheter may be inserted into your bladder through your urethra. This drains your urine during the procedure. °· You may be given one or more of the following: °? A medicine to numb the area (local anesthetic). °? A medicine to make you fall asleep (general anesthetic). °? A medicine (regional anesthetic) that is injected into your back or through a small   thin tube placed in your back (spinal anesthetic or epidural anesthetic). This numbs everything below the injection site and allows you to stay awake during your procedure. If this makes you feel nauseous, tell your health care provider. Medicines will be available to help reduce any nausea you may feel. °· An incision will be made in your abdomen, and then in your uterus. °· If you are awake during your  procedure, you may feel tugging and pulling in your abdomen, but you should not feel pain. If you feel pain, tell your health care provider immediately. °· Your baby will be removed from your uterus. You may feel more pressure or pushing while this happens. °· Immediately after birth, your baby will be dried and kept warm. You may be able to hold and breastfeed your baby. The umbilical cord may be clamped and cut during this time. °· Your placenta will be removed from your uterus. °· Your incisions will be closed with stitches (sutures). Staples, skin glue, or adhesive strips may also be applied to the incision in your abdomen. °· Bandages (dressings) will be placed over the incision in your abdomen. °The procedure may vary among health care providers and hospitals. °What happens after the procedure? °· Your blood pressure, heart rate, breathing rate, and blood oxygen level will be monitored often until the medicines you were given have worn off. °· You may continue to receive fluids and medicines through an IV tube. °· You will have some pain. Medicines will be available to help control your pain. °· To help prevent blood clots: °? You may be given medicines. °? You may have to wear compression stockings or devices. °? You will be encouraged to walk around when you are able. °· Hospital staff will encourage and support bonding with your baby. Your hospital may allow you and your baby to stay in the same room (rooming in) during your hospital stay to encourage successful breastfeeding. °· You may be encouraged to cough and breathe deeply often. This helps to prevent lung problems. °· If you have a catheter draining your urine, it will be removed as soon as possible after your procedure. °This information is not intended to replace advice given to you by your health care provider. Make sure you discuss any questions you have with your health care provider. °Document Released: 10/16/2005 Document Revised: 03/23/2016  Document Reviewed: 07/27/2015 °Elsevier Interactive Patient Education © 2017 [employer] ° °

## 2017-03-12 ENCOUNTER — Telehealth (HOSPITAL_COMMUNITY): Payer: Self-pay | Admitting: Lactation Services

## 2017-03-12 NOTE — Telephone Encounter (Signed)
OB wanted her to make appointment.  Mom is red/sore.  States latches are painful and she has been mostly pumping since painful latches. Pumping every 4 hrs - 5-6 times per day.  Pumping 3 oz with each pumping.  At most 5-6 oz. Supplementing with formula with about 2-3 oz a day of formula.  Feeds EBM first.     Lactation OP Appt made for next Tuesday, May 22 @ 1400.

## 2017-03-20 ENCOUNTER — Ambulatory Visit (HOSPITAL_COMMUNITY): Payer: BC Managed Care – PPO

## 2017-03-20 ENCOUNTER — Ambulatory Visit (HOSPITAL_COMMUNITY)
Admission: RE | Admit: 2017-03-20 | Discharge: 2017-03-20 | Disposition: A | Discharge status: Home / Self Care | Payer: BC Managed Care – PPO | Source: Ambulatory Visit | Attending: Obstetrics and Gynecology | Admitting: Obstetrics and Gynecology

## 2017-03-20 DIAGNOSIS — Z029 Encounter for administrative examinations, unspecified: Secondary | ICD-10-CM | POA: Diagnosis not present

## 2017-03-20 NOTE — Lactation Note (Signed)
Lactation Consult  Mother's reason for visit:   Mother was referred by her OB.Dr Langston Masker. Mother reports that she has nipple pain with cracking. Infant was in the NICU. Mother reports that she didn't see infant for almost 24 hours. She was able to see LC the next day and infant latched on with good latch.  Mother reports that NICU fed bottles of formula. Infant was taking 2.5 ounces of formula.   Visit Type:  Feeding assessment and assessment of mothers breast  Appointment Notes:  Mother reports that infant takes 4-6 ounces with each feeding. Mother reports that she has had a plugged duct twice. Mother has a long history of vaginal yeast infections. Mother takes lots of antibiotics for sinusitis that she has frequently . Mother currently has yeast around her incision. Mother describes burning stinging pain of both nipples. , she feels that her nipples are warm to touch. Mother reports that she pumps for 30 mins and that after she pumps she still feels full. Mother wants to exclusively breast feed. Mother reports that she has breastfed yesterday for the first time in one week.  Mother is using size 28 flanges and describes pain when pumping. She uses lots of coconut oil and lanolin on nipples when she pumps.   Consult:  Initial Lactation Consultant:  Michel Bickers  ________________________________________________________________________    ________________________________________________________________________  Mother's Name: Alison Wagner Type of delivery:  C-Section Breastfeeding Experience:  none Maternal Medical Conditions:  none Maternal Medications:  Prenatal vits  ________________________________________________________________________  Breastfeeding History (Post Discharge)  Frequency of breastfeeding: every 2-3 hours  Duration of feeding: 15 on each side  Supplementation  Formula:  Volume 2 ounces        Brand: Similac  Breastmilk:  Volume 4-5 ounces per  feeding  Method:  Bottle,   Pumping  Type of pump:  Spectra Frequency:  Every 2-3 hours Volume:  3-6 ounces  Infant Intake and Output Assessment  Voids :12-15  in 24 hrs.  Color:  Clear yellow Stools:  5-6 in 24 hrs.  Color:  Yellow  ________________________________________________________________________  Maternal Breast Assessment  Breast:  Full Nipple:  Erect Pain level:  2, when pumping pain scale is a 5, when breastfeeding infant bits and pain scale #8 Pain interventions:  Bra and coconut oil and lanolin  _______________________________________________________________________ Feeding Assessment/Evaluation: Mothers nipple are deep red, observed 1/4 of tissue around areola red. No cracking noted Mother complaints of bruning pain on the nipple and pain in the breast. I feel that mother has been poorly latching infant only getting the tip of the nipple.  I feel that mother likely has a mild case of yeast given her history and the burning pain in her nipples and her breast.   Initial feeding assessment: assist mother with placing infant in cross cradle hold. Infant latched on with widely flanged lips. Mother denies having only slight  pain with the latch, pain scale of 1-2 .  Observed rhythmic pattern of suckling for 15 mins. Infant transferred 24 ml mother encouraged to do frequent breast compression  Infant's oral assessment:  Variance  Positioning:  Cross cradle Left breast  LATCH documentation:  Latch:  2 = Grasps breast easily, tongue down, lips flanged, rhythmical sucking.  Audible swallowing:  2 = Spontaneous and intermittent  Type of nipple:  2 = Everted at rest and after stimulation  Comfort (Breast/Nipple):  1 = Filling, red/small blisters or bruises, mild/mod discomfort  Hold (Positioning):  2 = No assistance  needed to correctly position infant at breast  LATCH score:  9  Attached assessment:  Deep  Lips flanged:  Yes.    Lips untucked:  Yes.    Suck  assessment:  Displays both    Pre-feed weight:11-14.5, 5400 Post-feed weight:11-15.4, 5424 Amount transferred: 24 ml   Infant's oral assessment:  Variance  Positioning:  Cross cradle Right breast  LATCH documentation:  Latch:  2 = Grasps breast easily, tongue down, lips flanged, rhythmical sucking.  Audible swallowing:  2 = Spontaneous and intermittent  Type of nipple:  2 = Everted at rest and after stimulation  Comfort (Breast/Nipple):  1 = Filling, red/small blisters or bruises, mild/mod discomfort  Hold (Positioning):  2 = No assistance needed to correctly position infant at breast  LATCH score:  9  Attached assessment:  Deep  Lips flanged:  Yes.    Lips untucked:  Yes.    Suck assessment:  Displays both     Pre-feed weight:  5424, 11-15.4 Post-feed weight:12.3,5450 Amount transferred:  26 ml Amount supplemented:      Total amount transferred:  50 ml  Total supplement given:  Mother advised to give at least 3-4 ounces with a bottle  Encouraged to use a wide base bottle nipple  Mother to cue base feed infant and at least 8-12 times in 24 hours Mother to do good breast compresssion when feeding Supplement infant with at least 3-4 ounces after each breastfeeding Mother to continue to post pump 6 times daily for at least 15-20 mins Call OB for Diflucan. Advised mother to use Monistat or Antifungal of choice Mother was given yeast handout with review Suggested that infant be weighed next week [facility] next week or Peds office

## 2017-05-02 ENCOUNTER — Inpatient Hospital Stay (HOSPITAL_COMMUNITY)
Admission: AD | Admit: 2017-05-02 | Discharge: 2017-05-02 | Disposition: A | Discharge status: Home / Self Care | Payer: BC Managed Care – PPO | Source: Ambulatory Visit | Attending: Obstetrics & Gynecology | Admitting: Obstetrics & Gynecology

## 2017-05-02 DIAGNOSIS — B3789 Other sites of candidiasis: Secondary | ICD-10-CM | POA: Insufficient documentation

## 2017-05-02 LAB — CBC WITH DIFFERENTIAL/PLATELET
Basophils Absolute: 0 10*3/uL (ref 0.0–0.1)
Basophils Relative: 0 %
EOS PCT: 3 %
Eosinophils Absolute: 0.2 10*3/uL (ref 0.0–0.7)
HCT: 38.8 % (ref 36.0–46.0)
HEMOGLOBIN: 13.4 g/dL (ref 12.0–15.0)
LYMPHS ABS: 3.1 10*3/uL (ref 0.7–4.0)
LYMPHS PCT: 36 %
MCH: 29 pg (ref 26.0–34.0)
MCHC: 34.5 g/dL (ref 30.0–36.0)
MCV: 84 fL (ref 78.0–100.0)
MONOS PCT: 4 %
Monocytes Absolute: 0.3 10*3/uL (ref 0.1–1.0)
NEUTROS PCT: 58 %
Neutro Abs: 5 10*3/uL (ref 1.7–7.7)
Platelets: 238 10*3/uL (ref 150–400)
RBC: 4.62 MIL/uL (ref 3.87–5.11)
RDW: 13 % (ref 11.5–15.5)
WBC: 8.6 10*3/uL (ref 4.0–10.5)

## 2017-05-02 MED ORDER — IBUPROFEN 600 MG PO TABS: 600.0000 mg | ORAL_TABLET | Freq: Four times a day (QID) | ORAL | 1 refills | Status: DC | PRN

## 2017-05-02 MED ORDER — TRAMADOL HCL 50 MG PO TABS: 50.0000 mg | ORAL_TABLET | Freq: Four times a day (QID) | ORAL | 0 refills | Status: DC | PRN

## 2017-05-02 MED ORDER — GENTIAN VIOLET 1 % EX SOLN: CUTANEOUS | 0 refills | Status: DC

## 2017-05-02 MED ORDER — FLUCONAZOLE 100 MG PO TABS: 100.0000 mg | ORAL_TABLET | Freq: Two times a day (BID) | ORAL | 1 refills | Status: DC

## 2017-05-02 NOTE — Discharge Instructions (Signed)
Candidiasis and Breastfeeding The Candida organism is a fungus that lives in our bodies. It produces yeast cells and is kept at healthy levels by the natural bacteria in our bodies. Candida lives in warm, dark, and moist places of the body, such as skin folds under the breast and wet nipples covered by bras or nursing bra pads. When your body's natural balance of bacteria is upset, Candidacan overgrow, causing an infection. This type of infection is called candidiasis. What increases the risk? You may be at higher risk for developing candidiasis if you or your baby has been taking antibiotic medicines, your nipples are cracked, or you are taking oral contraceptives or steroids (such as for asthma). What are the signs or symptoms?  Severe stinging or burning pain, which may be on the surface of the nipples or may be felt deep inside the breast.  Pain during, in between, or especially right after feedings.  Sharp, shooting pain that spreads (radiates) from the nipple into the breast or into the back or arm.  Nipples suddenly become sore after the first two weeks after you give birth.  Sensitive nipples that may have pain with even a light touch. Nipples may also be: ? Puffy. ? Weepy. ? Itchy. ? Blistering. ? Cracked. ? Scaly. ? Reddish. ? Shiny. ? Flaky. How is this diagnosed? The diagnosis is often made based on the symptoms. Microscopic evaluation of breast discharge or cultures may be needed. How is this treated? Yeast can be passed back and forth between a mother and her baby. The mother and baby may need treatment at the same time in order to clear up the infection, even if one does not have symptoms. Occasionally other family members (especially your sexual partner) may need to be treated at the same time. Treatment may involve:  Applying antifungal cream to your nipples after each feeding.  Washing your nipples with warm water before nursing.  Stopping nursing from the affected  breast and using a breast pump.  Keeping the affected breast empty of milk with nursing or with a breast pump.  Medicine. This may be given if your baby has thrush or diaper rash. If you are nursing and you have candidiasis, your baby should be treated for thrush even if you cannot see any white patches in the baby's mouth.  If your infection is more severe, you may be prescribed medicines by mouth.  Talk to your health care provider before starting treatment. It is important to begin treatment only after making sure other things are not the cause of the problem. Follow these instructions at home: Usually after 24-48 hours, you should feel some improvement. In some cases, symptoms may get worse before they get better.  Only take medicines as directed by your health care provider. Make sure to finish all your medicines.  Only take over-the-counter or prescription medicines for pain, discomfort, or fever as directed by your health care provider.  Give your child medicines as directed by your health care provider. Make sure your child finishes them as directed by your health care provider.  Use creams or ointments as suggested by your health care provider.  Make sure your baby is seen and treated at the same time as you.  Wash your hands often. Wash them before and after nursing and changing your baby's diaper and after using the bathroom. Use hot, soapy water. Use soft towels or cloths to pat yourself dry.  Wash your baby's hands often, especially if he or she   sucks on his or her fingers.  If your baby uses a pacifier, it should be boiled for 20 minutes a day and replaced every week.  Nurse more often but for shorter periods of time. Start nursing on the least sore side.  Wash your breast pump and all its parts thoroughly in a bleach solution. Boil all parts that touch the milk (except the rubber gaskets).  If nursing becomes too painful, you may want to pump your milk temporarily and  feed it to your baby. Do not save or freeze this milk because if given to the baby after treatment is completed, it could cause the infection to return.  Eat yogurt that has live active cultures and take oral acidophilus.  Air dry your nipples after nursing.  Change bra pads after each feeding.  Wear 100% cotton bras and wash them every day in hot water.  Wash any towels or clothing that comes in contact with the infected area in very hot water (above 122F [50C]).  Contact a health care provider if: Seek medical care if:  You or your baby are not getting better or are getting worse with the treatment.  Your breasts develop shooting pains, discomfort, itching, or burning after you take antibiotics.  Get help right away if: Seek immediate medical care if:  You have a fever or persistent symptoms for more than 2-3 days.  You have a fever and your symptoms suddenly get worse.  You develop swelling and severe pain in your breast.  You develop blisters on your breast.  You feel a lump in your breast, with or without pain.  Your nipple starts bleeding.  This information is not intended to replace advice given to you by your health care provider. Make sure you discuss any questions you have with your health care provider. Document Released: 02/10/2005 Document Revised: 03/23/2016 Document Reviewed: 04/09/2013 Elsevier Interactive Patient Education  2018 [employer]  

## 2017-05-02 NOTE — MAU Note (Signed)
Patient has had ongoing issue with left breast.  Was seen by lactation over a month ago. And was told had a yeast infection. Cream was given. Didn't get better so she went back to the doctor and got diflucan. Still didn't get better so this past Friday went to the doctor and was given another antibiotic for bacterial mastitis.  Pain is 10/10.  Constant and sharp shooting in nature Took percocet at [employer]6am Took 2 200mg  ibuprofen at noon. With no relief.  Breast is notably reddened, dry, and cracked with white patches around nipple.  Delivered via csection 3 months ago.

## 2017-05-14 ENCOUNTER — Telehealth: Payer: Self-pay

## 2017-05-14 NOTE — Telephone Encounter (Signed)
Called patient left message to schedule follow up appointment

## 2017-09-11 ENCOUNTER — Other Ambulatory Visit: Payer: Self-pay | Admitting: Obstetrics & Gynecology

## 2017-09-11 DIAGNOSIS — N6310 Unspecified lump in the right breast, unspecified quadrant: Secondary | ICD-10-CM

## 2017-09-19 ENCOUNTER — Other Ambulatory Visit: Payer: Self-pay | Admitting: Obstetrics & Gynecology

## 2017-09-19 ENCOUNTER — Ambulatory Visit
Admission: RE | Admit: 2017-09-19 | Discharge: 2017-09-19 | Disposition: A | Discharge status: Home / Self Care | Payer: BC Managed Care – PPO | Source: Ambulatory Visit | Attending: Obstetrics & Gynecology | Admitting: Obstetrics & Gynecology

## 2017-09-19 DIAGNOSIS — N6310 Unspecified lump in the right breast, unspecified quadrant: Secondary | ICD-10-CM

## 2017-10-26 ENCOUNTER — Ambulatory Visit (INDEPENDENT_AMBULATORY_CARE_PROVIDER_SITE_OTHER): Payer: BC Managed Care – PPO

## 2017-10-26 ENCOUNTER — Other Ambulatory Visit: Payer: Self-pay

## 2017-10-26 ENCOUNTER — Ambulatory Visit (HOSPITAL_COMMUNITY)
Admission: EM | Admit: 2017-10-26 | Discharge: 2017-10-26 | Disposition: A | Discharge status: Home / Self Care | Payer: BC Managed Care – PPO | Attending: Internal Medicine | Admitting: Internal Medicine

## 2017-10-26 ENCOUNTER — Encounter (HOSPITAL_COMMUNITY): Payer: Self-pay | Admitting: Emergency Medicine

## 2017-10-26 DIAGNOSIS — J22 Unspecified acute lower respiratory infection: Secondary | ICD-10-CM

## 2017-10-26 MED ORDER — DOXYCYCLINE HYCLATE 100 MG PO CAPS: 100.0000 mg | ORAL_CAPSULE | Freq: Two times a day (BID) | ORAL | 0 refills | Status: AC

## 2017-10-26 MED ORDER — BENZONATATE 100 MG PO CAPS: 100.0000 mg | ORAL_CAPSULE | Freq: Three times a day (TID) | ORAL | 0 refills | Status: DC

## 2017-10-26 MED ORDER — FLUCONAZOLE 200 MG PO TABS: 200.0000 mg | ORAL_TABLET | Freq: Once | ORAL | 0 refills | Status: AC

## 2017-10-26 MED ORDER — PREDNISONE 20 MG PO TABS: 40.0000 mg | ORAL_TABLET | Freq: Every day | ORAL | 0 refills | Status: AC

## 2017-10-26 NOTE — Discharge Instructions (Signed)
Push fluids to ensure adequate hydration and keep secretions thin.  Continue with use of inhaler as needed for wheezing. Will switch antibiotic to try to get more broad coverage for potential pathogens. Please follow up with your primary care doctor in the next week for recheck.  If develop increased pain, fevers, increased shortness of breath or otherwise worsening please return to be seen or visit the ed.

## 2017-10-26 NOTE — ED Triage Notes (Signed)
Pt sent here from another UC for chest XRay, states shes had a cough for a month with bronchitis, concerned for pneumonia.
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
## 2018-03-19 ENCOUNTER — Inpatient Hospital Stay
Admission: RE | Admit: 2018-03-19 | Discharge: 2018-03-19 | Disposition: A | Discharge status: Home / Self Care | Payer: BC Managed Care – PPO | Source: Ambulatory Visit | Attending: Obstetrics & Gynecology | Admitting: Obstetrics & Gynecology

## 2018-04-11 ENCOUNTER — Ambulatory Visit
Admission: RE | Admit: 2018-04-11 | Discharge: 2018-04-11 | Disposition: A | Discharge status: Home / Self Care | Payer: BC Managed Care – PPO | Source: Ambulatory Visit | Attending: Obstetrics & Gynecology | Admitting: Obstetrics & Gynecology

## 2018-04-11 DIAGNOSIS — N6310 Unspecified lump in the right breast, unspecified quadrant: Secondary | ICD-10-CM

## 2020-01-28 LAB — OB RESULTS CONSOLE ABO/RH

## 2020-03-10 LAB — OB RESULTS CONSOLE ABO/RH: RH Type: POSITIVE

## 2020-03-10 LAB — OB RESULTS CONSOLE HEPATITIS B SURFACE ANTIGEN: Hepatitis B Surface Ag: NEGATIVE

## 2020-03-10 LAB — OB RESULTS CONSOLE GC/CHLAMYDIA
Chlamydia: NEGATIVE
Gonorrhea: NEGATIVE

## 2020-03-10 LAB — OB RESULTS CONSOLE ANTIBODY SCREEN: Antibody Screen: NEGATIVE

## 2020-03-10 LAB — OB RESULTS CONSOLE HIV ANTIBODY (ROUTINE TESTING): HIV: NONREACTIVE

## 2020-03-10 LAB — OB RESULTS CONSOLE RPR: RPR: NONREACTIVE

## 2020-03-10 LAB — OB RESULTS CONSOLE RUBELLA ANTIBODY, IGM: Rubella: IMMUNE

## 2020-09-01 LAB — OB RESULTS CONSOLE GBS: GBS: POSITIVE

## 2020-09-20 ENCOUNTER — Encounter (HOSPITAL_COMMUNITY): Payer: Self-pay | Admitting: *Deleted

## 2020-09-20 ENCOUNTER — Encounter (HOSPITAL_COMMUNITY): Payer: Self-pay

## 2020-09-20 NOTE — Patient Instructions (Addendum)
Alison Wagner  09/20/2020   Your procedure is scheduled on:  09/30/2020  Arrive at 1145 at Entrance C on [address] at [facility]  and [employer]. You are invited to use the FREE valet parking or use the Visitor's parking deck.  Pick up the phone at the desk and dial [phone].  Call this number if you have problems the morning of surgery: [phone]  Remember:   Do not eat food:(After Midnight) Desps de medianoche.  Do not drink clear liquids: (After Midnight) Desps de medianoche.  Take these medicines the morning of surgery with A SIP OF WATER:  none   Do not wear jewelry, make-up or nail polish.  Do not wear lotions, powders, or perfumes. Do not wear deodorant.  Do not shave 48 hours prior to surgery.  Do not bring valuables to the hospital.  [facility] is not   responsible for any belongings or valuables brought to the hospital.  Contacts, dentures or bridgework may not be worn into surgery.  Leave suitcase in the car. After surgery it may be brought to your room.  For patients admitted to the hospital, checkout time is 11:00 AM the day of              discharge.      Please read over the following fact sheets that you were given:     Preparing for Surgery

## 2020-09-28 ENCOUNTER — Encounter (HOSPITAL_COMMUNITY)
Admission: RE | Admit: 2020-09-28 | Discharge: 2020-09-28 | Disposition: A | Discharge status: Home / Self Care | Payer: BC Managed Care – PPO | Source: Ambulatory Visit | Attending: Obstetrics and Gynecology | Admitting: Obstetrics and Gynecology

## 2020-09-28 ENCOUNTER — Other Ambulatory Visit: Payer: Self-pay | Admitting: Obstetrics and Gynecology

## 2020-09-28 ENCOUNTER — Other Ambulatory Visit: Payer: Self-pay

## 2020-09-28 ENCOUNTER — Other Ambulatory Visit (HOSPITAL_COMMUNITY): Payer: BC Managed Care – PPO

## 2020-09-28 DIAGNOSIS — Z01812 Encounter for preprocedural laboratory examination: Secondary | ICD-10-CM | POA: Insufficient documentation

## 2020-09-28 LAB — TYPE AND SCREEN
ABO/RH(D): O POS
Antibody Screen: NEGATIVE

## 2020-09-28 LAB — CBC
HCT: 38.6 % (ref 36.0–46.0)
Hemoglobin: 12.9 g/dL (ref 12.0–15.0)
MCH: 29.3 pg (ref 26.0–34.0)
MCHC: 33.4 g/dL (ref 30.0–36.0)
MCV: 87.5 fL (ref 80.0–100.0)
Platelets: 221 10*3/uL (ref 150–400)
RBC: 4.41 MIL/uL (ref 3.87–5.11)
RDW: 12.7 % (ref 11.5–15.5)
WBC: 9.4 10*3/uL (ref 4.0–10.5)
nRBC: 0 % (ref 0.0–0.2)

## 2020-09-29 LAB — RPR: RPR Ser Ql: NONREACTIVE

## 2020-09-30 ENCOUNTER — Other Ambulatory Visit: Payer: Self-pay

## 2020-09-30 ENCOUNTER — Inpatient Hospital Stay (HOSPITAL_COMMUNITY)
Admission: AD | Admit: 2020-09-30 | Discharge: 2020-10-02 | DRG: 788 | Disposition: A | Discharge status: Home / Self Care | Payer: BC Managed Care – PPO | Attending: Obstetrics and Gynecology | Admitting: Obstetrics and Gynecology

## 2020-09-30 ENCOUNTER — Encounter (HOSPITAL_COMMUNITY): Payer: Self-pay | Admitting: Obstetrics and Gynecology

## 2020-09-30 ENCOUNTER — Inpatient Hospital Stay (HOSPITAL_COMMUNITY): Payer: BC Managed Care – PPO | Admitting: Anesthesiology

## 2020-09-30 ENCOUNTER — Encounter (HOSPITAL_COMMUNITY)
Admission: AD | Disposition: A | Discharge status: Home / Self Care | Payer: Self-pay | Source: Home / Self Care | Attending: Obstetrics and Gynecology

## 2020-09-30 DIAGNOSIS — O99824 Streptococcus B carrier state complicating childbirth: Secondary | ICD-10-CM | POA: Diagnosis present

## 2020-09-30 DIAGNOSIS — Z98891 History of uterine scar from previous surgery: Secondary | ICD-10-CM

## 2020-09-30 DIAGNOSIS — O34211 Maternal care for low transverse scar from previous cesarean delivery: Principal | ICD-10-CM | POA: Diagnosis present

## 2020-09-30 DIAGNOSIS — O403XX0 Polyhydramnios, third trimester, not applicable or unspecified: Secondary | ICD-10-CM | POA: Diagnosis present

## 2020-09-30 DIAGNOSIS — O3663X0 Maternal care for excessive fetal growth, third trimester, not applicable or unspecified: Secondary | ICD-10-CM | POA: Diagnosis present

## 2020-09-30 DIAGNOSIS — Z20822 Contact with and (suspected) exposure to covid-19: Secondary | ICD-10-CM | POA: Diagnosis present

## 2020-09-30 DIAGNOSIS — Z3A39 39 weeks gestation of pregnancy: Secondary | ICD-10-CM

## 2020-09-30 DIAGNOSIS — O34219 Maternal care for unspecified type scar from previous cesarean delivery: Secondary | ICD-10-CM | POA: Diagnosis present

## 2020-09-30 SURGERY — Surgical Case
Anesthesia: Spinal

## 2020-09-30 MED ORDER — PHENYLEPHRINE HCL-NACL 20-0.9 MG/250ML-% IV SOLN
INTRAVENOUS | Status: DC | PRN
  Administered 2020-09-30: 60 ug/min via INTRAVENOUS

## 2020-09-30 MED ORDER — SODIUM CHLORIDE 0.9 % IR SOLN
Status: DC | PRN
  Administered 2020-09-30: 1

## 2020-09-30 MED ORDER — MENTHOL 3 MG MT LOZG: 1.0000 | LOZENGE | OROMUCOSAL | Status: DC | PRN

## 2020-09-30 MED ORDER — POVIDONE-IODINE 10 % EX SWAB
2.0000 "application " | Freq: Once | CUTANEOUS | Status: AC
  Administered 2020-09-30: 2 via TOPICAL

## 2020-09-30 MED ORDER — ONDANSETRON HCL 4 MG/2ML IJ SOLN
INTRAMUSCULAR | Status: DC | PRN
  Administered 2020-09-30: 4 mg via INTRAVENOUS

## 2020-09-30 MED ORDER — MORPHINE SULFATE (PF) 0.5 MG/ML IJ SOLN
INTRAMUSCULAR | Status: AC
  Filled 2020-09-30: qty 10

## 2020-09-30 MED ORDER — CEFAZOLIN SODIUM-DEXTROSE 2-4 GM/100ML-% IV SOLN: 2.0000 g | INTRAVENOUS | Status: DC

## 2020-09-30 MED ORDER — BUPIVACAINE IN DEXTROSE 0.75-8.25 % IT SOLN
INTRATHECAL | Status: DC | PRN
  Administered 2020-09-30: 1.6 mL via INTRATHECAL

## 2020-09-30 MED ORDER — OXYCODONE HCL 5 MG/5ML PO SOLN: 5.0000 mg | Freq: Once | ORAL | Status: DC | PRN

## 2020-09-30 MED ORDER — NALBUPHINE HCL 10 MG/ML IJ SOLN
5.0000 mg | INTRAMUSCULAR | Status: DC | PRN
  Administered 2020-09-30: 5 mg via INTRAVENOUS
  Filled 2020-09-30: qty 1

## 2020-09-30 MED ORDER — DIPHENHYDRAMINE HCL 25 MG PO CAPS: 25.0000 mg | ORAL_CAPSULE | Freq: Four times a day (QID) | ORAL | Status: DC | PRN

## 2020-09-30 MED ORDER — SIMETHICONE 80 MG PO CHEW
80.0000 mg | CHEWABLE_TABLET | ORAL | Status: DC
  Administered 2020-09-30 – 2020-10-02 (×2): 80 mg via ORAL
  Filled 2020-09-30 (×2): qty 1

## 2020-09-30 MED ORDER — KETOROLAC TROMETHAMINE 30 MG/ML IJ SOLN
INTRAMUSCULAR | Status: AC
  Filled 2020-09-30: qty 1

## 2020-09-30 MED ORDER — PROMETHAZINE HCL 25 MG/ML IJ SOLN: 6.2500 mg | INTRAMUSCULAR | Status: DC | PRN

## 2020-09-30 MED ORDER — KETOROLAC TROMETHAMINE 30 MG/ML IJ SOLN
30.0000 mg | Freq: Four times a day (QID) | INTRAMUSCULAR | Status: AC
  Administered 2020-09-30 – 2020-10-01 (×3): 30 mg via INTRAVENOUS
  Filled 2020-09-30 (×3): qty 1

## 2020-09-30 MED ORDER — OXYCODONE HCL 5 MG PO TABS: 5.0000 mg | ORAL_TABLET | Freq: Once | ORAL | Status: DC | PRN

## 2020-09-30 MED ORDER — TRANEXAMIC ACID-NACL 1000-0.7 MG/100ML-% IV SOLN
INTRAVENOUS | Status: AC
  Filled 2020-09-30: qty 100

## 2020-09-30 MED ORDER — DIBUCAINE (PERIANAL) 1 % EX OINT: 1.0000 "application " | TOPICAL_OINTMENT | CUTANEOUS | Status: DC | PRN

## 2020-09-30 MED ORDER — NALOXONE HCL 4 MG/10ML IJ SOLN
1.0000 ug/kg/h | INTRAVENOUS | Status: DC | PRN
  Filled 2020-09-30: qty 5

## 2020-09-30 MED ORDER — DIPHENHYDRAMINE HCL 50 MG/ML IJ SOLN
12.5000 mg | Freq: Four times a day (QID) | INTRAMUSCULAR | Status: DC | PRN
  Administered 2020-09-30: 12.5 mg via INTRAVENOUS
  Filled 2020-09-30: qty 1

## 2020-09-30 MED ORDER — NALBUPHINE HCL 10 MG/ML IJ SOLN: 5.0000 mg | Freq: Once | INTRAMUSCULAR | Status: DC | PRN

## 2020-09-30 MED ORDER — COCONUT OIL OIL
1.0000 "application " | TOPICAL_OIL | Status: DC | PRN
  Administered 2020-10-01: 1 via TOPICAL

## 2020-09-30 MED ORDER — SODIUM CHLORIDE 0.9% FLUSH: 3.0000 mL | INTRAVENOUS | Status: DC | PRN

## 2020-09-30 MED ORDER — ONDANSETRON HCL 4 MG/2ML IJ SOLN: 4.0000 mg | Freq: Three times a day (TID) | INTRAMUSCULAR | Status: DC | PRN

## 2020-09-30 MED ORDER — DIPHENHYDRAMINE HCL 25 MG PO CAPS: 25.0000 mg | ORAL_CAPSULE | ORAL | Status: DC | PRN

## 2020-09-30 MED ORDER — TRANEXAMIC ACID-NACL 1000-0.7 MG/100ML-% IV SOLN
INTRAVENOUS | Status: DC | PRN
  Administered 2020-09-30: 1000 mg via INTRAVENOUS

## 2020-09-30 MED ORDER — LACTATED RINGERS IV SOLN: INTRAVENOUS | Status: DC

## 2020-09-30 MED ORDER — CEFAZOLIN SODIUM-DEXTROSE 2-4 GM/100ML-% IV SOLN
INTRAVENOUS | Status: AC
  Filled 2020-09-30: qty 100

## 2020-09-30 MED ORDER — PRENATAL MULTIVITAMIN CH
1.0000 | ORAL_TABLET | Freq: Every day | ORAL | Status: DC
  Administered 2020-10-01 – 2020-10-02 (×2): 1 via ORAL
  Filled 2020-09-30 (×2): qty 1

## 2020-09-30 MED ORDER — BUPIVACAINE HCL (PF) 0.25 % IJ SOLN
INTRAMUSCULAR | Status: DC | PRN
  Administered 2020-09-30: 20 mL

## 2020-09-30 MED ORDER — OXYTOCIN-SODIUM CHLORIDE 30-0.9 UT/500ML-% IV SOLN
INTRAVENOUS | Status: DC | PRN
  Administered 2020-09-30: 500 mL via INTRAVENOUS

## 2020-09-30 MED ORDER — OXYTOCIN-SODIUM CHLORIDE 30-0.9 UT/500ML-% IV SOLN
2.5000 [IU]/h | INTRAVENOUS | Status: AC
  Administered 2020-09-30: 2.5 [IU]/h via INTRAVENOUS

## 2020-09-30 MED ORDER — DEXAMETHASONE SODIUM PHOSPHATE 10 MG/ML IJ SOLN
INTRAMUSCULAR | Status: DC | PRN
  Administered 2020-09-30: 10 mg via INTRAVENOUS

## 2020-09-30 MED ORDER — FENTANYL CITRATE (PF) 100 MCG/2ML IJ SOLN: 25.0000 ug | INTRAMUSCULAR | Status: DC | PRN

## 2020-09-30 MED ORDER — METHYLERGONOVINE MALEATE 0.2 MG PO TABS: 0.2000 mg | ORAL_TABLET | ORAL | Status: DC | PRN

## 2020-09-30 MED ORDER — SCOPOLAMINE 1 MG/3DAYS TD PT72: 1.0000 | MEDICATED_PATCH | Freq: Once | TRANSDERMAL | Status: DC

## 2020-09-30 MED ORDER — CEFAZOLIN SODIUM-DEXTROSE 2-3 GM-%(50ML) IV SOLR
INTRAVENOUS | Status: DC | PRN
  Administered 2020-09-30: 2 g via INTRAVENOUS

## 2020-09-30 MED ORDER — ACETAMINOPHEN 10 MG/ML IV SOLN: 1000.0000 mg | Freq: Once | INTRAVENOUS | Status: DC | PRN

## 2020-09-30 MED ORDER — NALBUPHINE HCL 10 MG/ML IJ SOLN: 5.0000 mg | INTRAMUSCULAR | Status: DC | PRN

## 2020-09-30 MED ORDER — OXYTOCIN-SODIUM CHLORIDE 30-0.9 UT/500ML-% IV SOLN
INTRAVENOUS | Status: AC
  Filled 2020-09-30: qty 500

## 2020-09-30 MED ORDER — FENTANYL CITRATE (PF) 100 MCG/2ML IJ SOLN
INTRAMUSCULAR | Status: DC | PRN
  Administered 2020-09-30: 15 ug via INTRATHECAL

## 2020-09-30 MED ORDER — FENTANYL CITRATE (PF) 100 MCG/2ML IJ SOLN
INTRAMUSCULAR | Status: AC
  Filled 2020-09-30: qty 2

## 2020-09-30 MED ORDER — ZOLPIDEM TARTRATE 5 MG PO TABS: 5.0000 mg | ORAL_TABLET | Freq: Every evening | ORAL | Status: DC | PRN

## 2020-09-30 MED ORDER — WITCH HAZEL-GLYCERIN EX PADS: 1.0000 "application " | MEDICATED_PAD | CUTANEOUS | Status: DC | PRN

## 2020-09-30 MED ORDER — TETANUS-DIPHTH-ACELL PERTUSSIS 5-2.5-18.5 LF-MCG/0.5 IM SUSY: 0.5000 mL | PREFILLED_SYRINGE | Freq: Once | INTRAMUSCULAR | Status: DC

## 2020-09-30 MED ORDER — IBUPROFEN 800 MG PO TABS
800.0000 mg | ORAL_TABLET | Freq: Four times a day (QID) | ORAL | Status: DC
  Administered 2020-10-01 – 2020-10-02 (×4): 800 mg via ORAL
  Filled 2020-09-30 (×4): qty 1

## 2020-09-30 MED ORDER — MORPHINE SULFATE (PF) 0.5 MG/ML IJ SOLN
INTRAMUSCULAR | Status: DC | PRN
  Administered 2020-09-30: 150 ug via INTRATHECAL

## 2020-09-30 MED ORDER — KETOROLAC TROMETHAMINE 30 MG/ML IJ SOLN
30.0000 mg | Freq: Once | INTRAMUSCULAR | Status: AC
  Administered 2020-09-30: 30 mg via INTRAVENOUS

## 2020-09-30 MED ORDER — NALOXONE HCL 0.4 MG/ML IJ SOLN: 0.4000 mg | INTRAMUSCULAR | Status: DC | PRN

## 2020-09-30 MED ORDER — DEXAMETHASONE SODIUM PHOSPHATE 10 MG/ML IJ SOLN
INTRAMUSCULAR | Status: AC
  Filled 2020-09-30: qty 1

## 2020-09-30 MED ORDER — SIMETHICONE 80 MG PO CHEW: 80.0000 mg | CHEWABLE_TABLET | ORAL | Status: DC | PRN

## 2020-09-30 MED ORDER — OXYCODONE-ACETAMINOPHEN 5-325 MG PO TABS
1.0000 | ORAL_TABLET | ORAL | Status: DC | PRN
  Administered 2020-10-01 – 2020-10-02 (×4): 1 via ORAL
  Filled 2020-09-30 (×4): qty 1

## 2020-09-30 MED ORDER — MEPERIDINE HCL 25 MG/ML IJ SOLN: 6.2500 mg | INTRAMUSCULAR | Status: DC | PRN

## 2020-09-30 MED ORDER — SENNOSIDES-DOCUSATE SODIUM 8.6-50 MG PO TABS
2.0000 | ORAL_TABLET | ORAL | Status: DC
  Administered 2020-09-30 – 2020-10-02 (×2): 2 via ORAL
  Filled 2020-09-30 (×2): qty 2

## 2020-09-30 MED ORDER — BUPIVACAINE HCL (PF) 0.25 % IJ SOLN
INTRAMUSCULAR | Status: AC
  Filled 2020-09-30: qty 20

## 2020-09-30 MED ORDER — PHENYLEPHRINE HCL-NACL 20-0.9 MG/250ML-% IV SOLN
INTRAVENOUS | Status: AC
  Filled 2020-09-30: qty 250

## 2020-09-30 MED ORDER — METHYLERGONOVINE MALEATE 0.2 MG/ML IJ SOLN: 0.2000 mg | INTRAMUSCULAR | Status: DC | PRN

## 2020-09-30 MED ORDER — SIMETHICONE 80 MG PO CHEW
80.0000 mg | CHEWABLE_TABLET | Freq: Three times a day (TID) | ORAL | Status: DC
  Administered 2020-09-30 – 2020-10-02 (×6): 80 mg via ORAL
  Filled 2020-09-30 (×6): qty 1

## 2020-09-30 MED ORDER — ONDANSETRON HCL 4 MG/2ML IJ SOLN
INTRAMUSCULAR | Status: AC
  Filled 2020-09-30: qty 2

## 2020-09-30 SURGICAL SUPPLY — 40 items
APL SKNCLS STERI-STRIP NONHPOA (GAUZE/BANDAGES/DRESSINGS) ×1
BENZOIN TINCTURE PRP APPL 2/3 (GAUZE/BANDAGES/DRESSINGS) ×2 IMPLANT
CHLORAPREP W/TINT 26ML (MISCELLANEOUS) ×3 IMPLANT
CLAMP CORD UMBIL (MISCELLANEOUS) IMPLANT
CLOSURE WOUND 1/2 X4 (GAUZE/BANDAGES/DRESSINGS) ×1
CLOTH BEACON ORANGE TIMEOUT ST (SAFETY) ×3 IMPLANT
DRSG OPSITE POSTOP 4X10 (GAUZE/BANDAGES/DRESSINGS) ×3 IMPLANT
ELECT REM PT RETURN 9FT ADLT (ELECTROSURGICAL) ×3
ELECTRODE REM PT RTRN 9FT ADLT (ELECTROSURGICAL) ×1 IMPLANT
EXTRACTOR VACUUM M CUP 4 TUBE (SUCTIONS) IMPLANT
EXTRACTOR VACUUM M CUP 4' TUBE (SUCTIONS)
GLOVE BIO SURGEON STRL SZ7.5 (GLOVE) ×3 IMPLANT
GLOVE BIOGEL PI IND STRL 7.0 (GLOVE) ×1 IMPLANT
GLOVE BIOGEL PI INDICATOR 7.0 (GLOVE) ×2
GOWN STRL REUS W/TWL LRG LVL3 (GOWN DISPOSABLE) ×6 IMPLANT
KIT ABG SYR 3ML LUER SLIP (SYRINGE) IMPLANT
NDL HYPO 25X5/8 SAFETYGLIDE (NEEDLE) IMPLANT
NDL SPNL 20GX3.5 QUINCKE YW (NEEDLE) IMPLANT
NEEDLE HYPO 22GX1.5 SAFETY (NEEDLE) ×3 IMPLANT
NEEDLE HYPO 25X5/8 SAFETYGLIDE (NEEDLE) IMPLANT
NEEDLE SPNL 20GX3.5 QUINCKE YW (NEEDLE) IMPLANT
NS IRRIG 1000ML POUR BTL (IV SOLUTION) ×3 IMPLANT
PACK C SECTION WH (CUSTOM PROCEDURE TRAY) ×3 IMPLANT
PENCIL SMOKE EVAC W/HOLSTER (ELECTROSURGICAL) ×3 IMPLANT
STRIP CLOSURE SKIN 1/2X4 (GAUZE/BANDAGES/DRESSINGS) ×1 IMPLANT
SUT MNCRL 0 VIOLET CTX 36 (SUTURE) ×2 IMPLANT
SUT MNCRL AB 3-0 PS2 27 (SUTURE) IMPLANT
SUT MON AB 2-0 CT1 27 (SUTURE) ×3 IMPLANT
SUT MON AB-0 CT1 36 (SUTURE) ×6 IMPLANT
SUT MONOCRYL 0 CTX 36 (SUTURE) ×9
SUT PLAIN 0 NONE (SUTURE) IMPLANT
SUT PLAIN 2 0 (SUTURE)
SUT PLAIN 2 0 XLH (SUTURE) ×2 IMPLANT
SUT PLAIN ABS 2-0 CT1 27XMFL (SUTURE) IMPLANT
SUT VIC AB 4-0 KS 27 (SUTURE) ×2 IMPLANT
SYR 20CC LL (SYRINGE) IMPLANT
SYR CONTROL 10ML LL (SYRINGE) ×3 IMPLANT
TOWEL OR 17X24 6PK STRL BLUE (TOWEL DISPOSABLE) ×3 IMPLANT
TRAY FOLEY W/BAG SLVR 14FR LF (SET/KITS/TRAYS/PACK) ×3 IMPLANT
WATER STERILE IRR 1000ML POUR (IV SOLUTION) ×3 IMPLANT

## 2020-09-30 NOTE — Anesthesia Preprocedure Evaluation (Addendum)
Anesthesia Evaluation  Patient identified by MRN, date of birth, ID band Patient awake    Reviewed: Allergy & Precautions, NPO status , Patient's Chart, lab work & pertinent test results  Airway Mallampati: II  TM Distance: >3 FB Neck ROM: Full    Dental no notable dental hx.    Pulmonary neg pulmonary ROS,    Pulmonary exam normal breath sounds clear to auscultation       Cardiovascular negative cardio ROS Normal cardiovascular exam Rhythm:Regular Rate:Normal     Neuro/Psych  Headaches, PSYCHIATRIC DISORDERS Anxiety    GI/Hepatic negative GI ROS, Neg liver ROS,   Endo/Other  negative endocrine ROS  Renal/GU negative Renal ROS     Musculoskeletal negative musculoskeletal ROS (+)   Abdominal   Peds  Hematology negative hematology ROS (+)   Anesthesia Other Findings Previous Cesarean Section  Reproductive/Obstetrics (+) Pregnancy                            Anesthesia Physical Anesthesia Plan  ASA: II  Anesthesia Plan: Spinal   Post-op Pain Management:    Induction: Intravenous  PONV Risk Score and Plan: 2 and Ondansetron, Dexamethasone and Treatment may vary due to age or medical condition  Airway Management Planned: Natural Airway  Additional Equipment:   Intra-op Plan:   Post-operative Plan:   Informed Consent: I have reviewed the patients History and Physical, chart, labs and discussed the procedure including the risks, benefits and alternatives for the proposed anesthesia with the patient or authorized representative who has indicated his/her understanding and acceptance.     Dental advisory given  Plan Discussed with: CRNA  Anesthesia Plan Comments:         Anesthesia Quick Evaluation

## 2020-09-30 NOTE — Lactation Note (Signed)
This note was copied from a baby's chart. Lactation Consultation Note  Patient Name: Alison Wagner [id number] Date: 09/30/2020 Reason for consult: Initial assessment  Initial visit to 6 hours old infant of a P2 mother. Mother has experience breastfeeding but mostly pumping and bottlefeeding since first child was a NICU infant.  Infant is breastfeeding upon arrival. Mother states infant latched and was skin to skin after delivery. Noted flanged lips, breast tissue movement, suckling and swallowing. Mother reports some discomfort with latch. Unlatched infant and observed compressed nipple.  Talked to mother about deep latch. Set up support pillows for football position to right breast. Demonstrated alignment. Colostrum is easily expressed. Latched infant after a couple of attempts. Noted suckling and swallowing. Mother describes a pinching sensation. Tried chin tug and mother verbalized improvement. Infant breastfed for ~73minutes.  Placed infant skin to skin.   Plan: 1-Breastfeeding on demand, ensuring a deep, comfortable latch.  2-Offer breast 8-12 times in 24h period to establish good milk supply. 3-Undressing infant and place skin to skin when ready to breastfeed 4-Keep infant awake during breastfeeding session: massaging breast, infant's hand/shoulder/feet 5-Monitor voids and stools as signs good intake.  6-Encouraged maternal rest, hydration and food intake.  7-Contact LC as needed for feeds/support/concerns/questions   All questions answered at this time. Provided Lactation services brochure.    Maternal Data Formula Feeding for Exclusion: No Has patient been taught Hand Expression?: Yes Does the patient have breastfeeding experience prior to this delivery?: Yes  Feeding Feeding Type: Breast Fed  LATCH Score Latch: Grasps breast easily, tongue down, lips flanged, rhythmical sucking.  Audible Swallowing: Spontaneous and intermittent  Type of Nipple: Everted at rest and after  stimulation (short shaft)  Comfort (Breast/Nipple): Soft / non-tender  Hold (Positioning): Assistance needed to correctly position infant at breast and maintain latch.  LATCH Score: 9  Interventions Interventions: Breast feeding basics reviewed;Assisted with latch;Skin to skin;Breast massage;Hand express;Adjust position;Support pillows;Expressed milk  Lactation Tools Discussed/Used WIC Program: No   Consult Status Consult Status: Follow-up Date: 10/01/20 Follow-up type: In-patient    Lakethia Coppess A Higuera Ancidey 09/30/2020, 8:37 PM

## 2020-09-30 NOTE — Anesthesia Postprocedure Evaluation (Signed)
Anesthesia Post Note  Patient: Alison Wagner  Procedure(s) Performed: Repeat CESAREAN SECTION (N/A )     Patient location during evaluation: PACU Anesthesia Type: Spinal Level of consciousness: oriented and awake and alert Pain management: pain level controlled Vital Signs Assessment: post-procedure vital signs reviewed and stable Respiratory status: spontaneous breathing, respiratory function stable and patient connected to nasal cannula oxygen Cardiovascular status: blood pressure returned to baseline and stable Postop Assessment: no headache, no backache and no apparent nausea or vomiting Anesthetic complications: no   No complications documented.  Last Vitals:  Vitals:   09/30/20 1632 09/30/20 1746  BP: 118/75 122/77  Pulse: 70 63  Resp:  18  Temp: (!) 36.3 C 36.8 C  SpO2: 100%     Last Pain:  Vitals:   09/30/20 1746  TempSrc: Oral  PainSc:    Pain Goal:                   Catheryn Bacon [name]

## 2020-09-30 NOTE — Anesthesia Procedure Notes (Signed)
Spinal  Patient location during procedure: OR Start time: 09/30/2020 2:00 PM End time: 09/30/2020 2:05 PM Staffing Performed: anesthesiologist and other anesthesia staff  Anesthesiologist: Leonides Grills, MD Preanesthetic Checklist Completed: patient identified, IV checked, risks and benefits discussed, surgical consent, monitors and equipment checked, pre-op evaluation and timeout performed Spinal Block Patient position: sitting Prep: DuraPrep Patient monitoring: cardiac monitor, continuous pulse ox and blood pressure Approach: midline Location: L4-5 Injection technique: single-shot Needle Needle type: Pencan  Needle gauge: 24 G Needle length: 9 cm Assessment Sensory level: T10 Additional Notes Functioning IV was confirmed and monitors were applied. Procedure performed with CRNA student. Sterile prep and drape, including hand hygiene and sterile gloves were used. The patient was positioned and the spine was prepped. The skin was anesthetized with lidocaine.  Free flow of clear CSF was obtained prior to injecting local anesthetic into the CSF.  The spinal needle aspirated freely following injection.  The needle was carefully withdrawn.  The patient tolerated the procedure well.

## 2020-09-30 NOTE — Progress Notes (Signed)
Patient seen and examined. Consent witnessed and signed. No changes noted. Update completed. BP (!) 141/94   Pulse 87   Temp 98.1 F (36.7 C) (Oral)   Resp 18   Wt 98 kg   BMI 35.94 kg/m   CBC    Component Value Date/Time   WBC 9.4 09/28/2020 1030   RBC 4.41 09/28/2020 1030   HGB 12.9 09/28/2020 1030   HCT 38.6 09/28/2020 1030   PLT 221 09/28/2020 1030   MCV 87.5 09/28/2020 1030   MCV 91.3 10/25/2014 1459   MCH 29.3 09/28/2020 1030   MCHC 33.4 09/28/2020 1030   RDW 12.7 09/28/2020 1030   LYMPHSABS 3.1 05/02/2017 1705   MONOABS 0.3 05/02/2017 1705   EOSABS 0.2 05/02/2017 1705   BASOSABS 0.0 05/02/2017 1705
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
## 2020-09-30 NOTE — Op Note (Signed)
Cesarean Section Procedure Note  Indications: macrosomia and previous uterine incision kerr x one  Pre-operative Diagnosis: 39 week 3 day pregnancy.  Post-operative Diagnosis: same  Surgeon: Lenoard Aden   Assistants: Renae Fickle, CNM  Anesthesia: Local anesthesia 0.25.% bupivacaine and Spinal anesthesia  ASA Class: 2  Procedure Details  The patient was seen in the Holding Room. The risks, benefits, complications, treatment options, and expected outcomes were discussed with the patient.  The patient concurred with the proposed plan, giving informed consent. The risks of anesthesia, infection, bleeding and possible injury to other organs discussed. Injury to bowel, bladder, or ureter with possible need for repair discussed. Possible need for transfusion with secondary risks of hepatitis or HIV acquisition discussed. Post operative complications to include but not limited to DVT, PE and Pneumonia noted. The site of surgery properly noted/marked. The patient was taken to Operating Room # C, identified as Dellie Catholic and the procedure verified as C-Section Delivery. A Time Out was held and the above information confirmed.  After induction of anesthesia, the patient was draped and prepped in the usual sterile manner. A Pfannenstiel incision was made and carried down through the subcutaneous tissue to the fascia. Fascial incision was made and extended transversely using Mayo scissors. The fascia was separated from the underlying rectus tissue superiorly and inferiorly. The peritoneum was identified and entered. Peritoneal incision was extended longitudinally. The utero-vesical peritoneal reflection was incised transversely and the bladder flap was bluntly freed from the lower uterine segment. A low transverse uterine incision(Kerr hysterotomy) was made. Delivered from OA presentation was a  female with Apgar scores of 8 at one minute and 9 at five minutes. Bulb suctioning gently performed. Neonatal team  in attendance.After the umbilical cord was clamped and cut cord blood was obtained for evaluation. The placenta was removed intact and appeared normal. The uterus was curetted with a dry lap pack. Good hemostasis was noted.The uterine outline, tubes and ovaries appeared normal. The uterine incision was closed with running locked sutures of 0 Monocryl x 2 layers. Hemostasis was observed. The parietal peritoneum was closed with a running 2-0 Monocryl suture. The fascia was then reapproximated with running sutures of 0 Monocryl. The skin was reapproximated with 4-0 vicryl after [place] closure with 2-0 plain.  Instrument, sponge, and needle counts were correct prior the abdominal closure and at the conclusion of the case.   Findings: FTLM, OA, post placenta, nl adnexa  Estimated Blood Loss:  400         Drains: foley                 Specimens: placenta to LD                 Complications:  None; patient tolerated the procedure well.         Disposition: PACU - hemodynamically stable.         Condition: stable  Attending Attestation: I performed the procedure.

## 2020-09-30 NOTE — Social Work (Signed)
MOB was referred for history of anxiety.   * Referral screened out by Clinical Social Worker because none of the following criteria appear to apply:  ~ History of anxiety/depression during this pregnancy, or of post-partum depression following prior delivery. ~ Diagnosis of anxiety and/or depression within last 3 years. Per chart review, noted date of anxiety is 2015.  OR * MOB's symptoms currently being treated with medication and/or therapy.  Please contact the Clinical Social Worker if needs arise, by [facility] request, or if MOB scores greater than 9/yes to question 10 on Edinburgh Postpartum Depression Screen.  Manfred Arch, LCSWA Clinical Social Work [employer] and [employer]  [phone]

## 2020-09-30 NOTE — Transfer of Care (Signed)
Immediate Anesthesia Transfer of Care Note  Patient: Alison Wagner  Procedure(s) Performed: Repeat CESAREAN SECTION (N/A )  Patient Location: PACU  Anesthesia Type:Spinal  Level of Consciousness: awake  Airway & Oxygen Therapy: Patient Spontanous Breathing  Post-op Assessment: Report given to RN  Post vital signs: Reviewed and stable  Last Vitals:  Vitals Value Taken Time  BP 114/71 09/30/20 1510  Temp    Pulse 80 09/30/20 1512  Resp 19 09/30/20 1512  SpO2 99 % 09/30/20 1512  Vitals shown include unvalidated device data.  Last Pain:  Vitals:   09/30/20 1211  TempSrc: Oral         Complications: No complications documented.

## 2020-10-01 DIAGNOSIS — Z98891 History of uterine scar from previous surgery: Secondary | ICD-10-CM

## 2020-10-01 LAB — CBC
HCT: 36.5 % (ref 36.0–46.0)
Hemoglobin: 11.9 g/dL — ABNORMAL LOW (ref 12.0–15.0)
MCH: 29.2 pg (ref 26.0–34.0)
MCHC: 32.6 g/dL (ref 30.0–36.0)
MCV: 89.5 fL (ref 80.0–100.0)
Platelets: 237 10*3/uL (ref 150–400)
RBC: 4.08 MIL/uL (ref 3.87–5.11)
RDW: 12.8 % (ref 11.5–15.5)
WBC: 17.2 10*3/uL — ABNORMAL HIGH (ref 4.0–10.5)
nRBC: 0 % (ref 0.0–0.2)

## 2020-10-01 LAB — BIRTH TISSUE RECOVERY COLLECTION (PLACENTA DONATION)

## 2020-10-01 NOTE — Progress Notes (Signed)
Subjective: POD# 1 Live born female  Birth Weight: 9 lb 6.4 oz (4265 g) APGAR: 9, 9  Newborn Delivery   Birth date/time: 09/30/2020 14:24:00 Delivery type: C-Section, Low Transverse Trial of labor: No C-section categorization: Repeat     Baby name: Pricilla Holm Delivering provider: Olivia Mackie   Circumcision Yes, planning inpatient Feeding: breast  Pain control at delivery: Spinal   Reports feeling well but more pain with movement. Pain is well controlled with PO meds. Voiding without difficulty. Breastfeeding challenging due to shallow latch and cracked nipples. Patient reports tolerating PO.   Pain controlled with ibuprofen (OTC) and narcotic analgesics including Oxy IR Denies HA/SOB/C/P/N/V/dizziness. Flatus no. She reports vaginal bleeding as normal, without clots. She is ambulating and urinating without difficulty.     Assisted mother with latch and positioning. Infant with good suck/swallow and deep latch. Reassurance provided.   Objective: Vitals:   09/30/20 1746 09/30/20 2230 10/01/20 0300 10/01/20 0630  BP: 122/77 117/74  121/84  Pulse: 63 62  62  Resp: 18 18 16 18   Temp: 98.3 F (36.8 C) 97.6 F (36.4 C) 97.8 F (36.6 C) 98.1 F (36.7 C)  TempSrc: Oral Oral Oral   SpO2:  99%  98%  Weight:         Intake/Output Summary (Last 24 hours) at 10/01/2020 0957 Last data filed at 10/01/2020 0915 Gross per 24 hour  Intake 2228.12 ml  Output 1470 ml  Net 758.12 ml      Recent Labs    09/28/20 1030 10/01/20 0544  WBC 9.4 17.2*  HGB 12.9 11.9*  HCT 38.6 36.5  PLT 221 237    Blood type: --/--/O POS (11/30 1006)  Rubella: Immune (05/12 0000)   Vaccines:   TDaP          UTD           Flu             UTD                      COVID-19 Declined  Physical Exam:  General: alert and cooperative CV: Regular rate and rhythm Resp: clear Abdomen: soft, nontender, normal bowel sounds Incision: clean, dry and intact Uterine Fundus: firm, below umbilicus,  nontender Lochia: moderate Ext: extremities normal, atraumatic, no cyanosis or edema and no edema, redness or tenderness in the calves or thighs  Assessment/Plan: 29 y.o.   POD# 1. 37                  Principal Problem:   Postpartum care following cesarean delivery 12/2 Active Problems:   Previous cesarean section   Status post repeat low transverse cesarean section 12/2          Encourage rest when baby rests Breastfeeding support prn Encourage to ambulate Routine post-op care  Anticipate discharge tomorrow.   14/2, CNM, MSN 10/01/2020, 9:57 AM
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
## 2020-10-02 MED ORDER — SENNOSIDES-DOCUSATE SODIUM 8.6-50 MG PO TABS: 2.0000 | ORAL_TABLET | Freq: Every day | ORAL | 1 refills | Status: AC

## 2020-10-02 MED ORDER — OXYCODONE-ACETAMINOPHEN 5-325 MG PO TABS
1.0000 | ORAL_TABLET | ORAL | 0 refills | Status: DC | PRN
Start: 2020-10-02 — End: 2022-08-17

## 2020-10-02 MED ORDER — IBUPROFEN 800 MG PO TABS: 800.0000 mg | ORAL_TABLET | Freq: Four times a day (QID) | ORAL | 0 refills | Status: AC

## 2020-10-02 NOTE — Lactation Note (Signed)
This note was copied from a baby's chart. Lactation Consultation Note  Patient Name: Alison Wagner UKGUR'[id number] Date: 10/02/2020 Reason for consult: Term;1st time breastfeeding  P2 mother whose infant is now 18 hours old.  This is a term baby at 39+3 weeks.  Mother's first child was in the NICU and she did not breast feed that baby.  She did pump and bottle feed for 13 months.  RN requested latch assistance.    Father was holding baby when I arrived.  Basic education completed and mother positioned appropriately.  Mother demonstrated hand expression and was able to easily express many colostrum drops which I finger fed back to baby.  Assisted to latch in the football hold on the right breast without difficulty.  Mother denied pain with latching.  In depth education provided and questions answered while baby nursed for 25 minutes.  Audible swallows noted.  Parents happy to see this progress.    Mother will continue to feed 8-12 times/24 hours or sooner if baby shows feeding cues.  Suggested she hand express before/after feedings to help increase milk supply.  Engorgement prevention/treatment reviewed.  Mother has a manual pump and a DEBP for home use.  Parents very receptive to teaching and support each other.  RN updated.   Maternal Data    Feeding Feeding Type: Breast Fed  LATCH Score Latch: Grasps breast easily, tongue down, lips flanged, rhythmical sucking.  Audible Swallowing: Spontaneous and intermittent  Type of Nipple: Everted at rest and after stimulation  Comfort (Breast/Nipple): Filling, red/small blisters or bruises, mild/mod discomfort  Hold (Positioning): Assistance needed to correctly position infant at breast and maintain latch.  LATCH Score: 8  Interventions Interventions: Breast feeding basics reviewed;Assisted with latch;Skin to skin;Breast massage;Hand express;Breast compression;Adjust position;DEBP;Hand pump;Comfort gels;Coconut oil;Position options;Support  pillows  Lactation Tools Discussed/Used     Consult Status Consult Status: Complete Date: 10/02/20 Follow-up type: In-patient    Darell Saputo R Michale Weikel 10/02/2020, 10:45 AM

## 2020-10-02 NOTE — Discharge Summary (Signed)
OB Discharge Summary  Patient Name: Alison Wagner DOB: 06-13-1991 MRN: [id number]  Date of admission: 09/30/2020 Delivering MD: Olivia Mackie   Date of discharge: 10/02/2020  Admitting diagnosis: Previous cesarean section [Z98.891] Previous cesarean delivery affecting pregnancy [O34.219] Intrauterine pregnancy: [redacted]w[redacted]d     Secondary diagnosis:Principal Problem:   Postpartum care following cesarean delivery 12/2 Active Problems:   Previous cesarean section   Status post repeat low transverse cesarean section 12/2  Additional problems: None     Discharge diagnosis: Term Pregnancy Delivered                                                                     Post partum procedures:None  Augmentation: None, scheduled repeat cesarean section  Complications: None  Hospital course:  Sceduled C/S   29 y.o. yo G2P2001 at [redacted]w[redacted]d was admitted to the hospital 09/30/2020 for scheduled cesarean section with the following indication:Elective Repeat.Delivery details are as follows:  Membrane Rupture Time/Date: 2:24 PM ,09/30/2020   Delivery Method:C-Section, Low Transverse  Details of operation can be found in separate operative note.  Patient had an uncomplicated postpartum course.  She is ambulating, tolerating a regular diet, passing flatus, and urinating well. Patient is discharged home in stable condition on  10/02/20        Newborn Data: Birth date:09/30/2020  Birth time:2:24 PM  Gender:Female  Living status:Living  Apgars:9 ,9  Weight:4265 g     On day of discharge patient asked to go home. Patient stated ambulating without dizziness, minimal lochia, pain controlled with p.o. medicines. Patient notes normal diet, normal void, resumed bowel movement. Patient declined Covid vaccine but has been vaccinated against flu.  Physical exam  Vitals:   10/01/20 0630 10/01/20 1505 10/01/20 2005 10/02/20 0505  BP: 121/84 114/82 111/76 120/82  Pulse: 62 84 98 73  Resp: 18 18 18 18   Temp:  98.1 F (36.7 C) 98.3 F (36.8 C) 98 F (36.7 C) 98.2 F (36.8 C)  TempSrc:  Oral Oral Oral  SpO2: 98%  99% 99%  Weight:       General: alert, cooperative and no distress Lochia: appropriate Uterine Fundus: firm Incision: Healing well with no significant drainage DVT Evaluation: No evidence of DVT seen on physical exam. Labs: Lab Results  Component Value Date   WBC 17.2 (H) 10/01/2020   HGB 11.9 (L) 10/01/2020   HCT 36.5 10/01/2020   MCV 89.5 10/01/2020   PLT 237 10/01/2020   CMP Latest Ref Rng & Units 02/13/2017  Glucose 65 - 99 mg/dL 98  BUN 6 - 20 mg/dL 7  Creatinine 02/15/2017 - 3.33 mg/dL 5.45  Sodium 6.25 - 638 mmol/L 135  Potassium 3.5 - 5.1 mmol/L 3.6  Chloride 101 - 111 mmol/L 104  CO2 22 - 32 mmol/L 24  Calcium 8.9 - 10.3 mg/dL 8.1(L)  Total Protein 6.5 - 8.1 g/dL 937)  Total Bilirubin 0.3 - 1.2 mg/dL 0.6  Alkaline Phos 38 - 126 U/L 90  AST 15 - 41 U/L 22  ALT 14 - 54 U/L 17    Discharge instruction: per After Visit Summary and "Baby and Me Booklet".  After Visit Meds:  Allergies as of 10/02/2020      Reactions  Sulfonamide Derivatives Hives, Shortness Of Breath, Rash      Medication List    TAKE these medications   acetaminophen 325 MG tablet Commonly known as: TYLENOL Take 650 mg by mouth every 6 (six) hours as needed for headache.   cyclobenzaprine 5 MG tablet Commonly known as: FLEXERIL Take 5 mg by mouth 3 (three) times daily as needed for muscle spasms.   famotidine 10 MG tablet Commonly known as: PEPCID Take 10 mg by mouth daily.   ibuprofen 800 MG tablet Commonly known as: ADVIL Take 1 tablet (800 mg total) by mouth every 6 (six) hours.   oxyCODONE-acetaminophen 5-325 MG tablet Commonly known as: PERCOCET/ROXICET Take 1-2 tablets by mouth every 4 (four) hours as needed for moderate pain.   prenatal multivitamin Tabs tablet Take 1 tablet by mouth daily.   senna-docusate 8.6-50 MG tablet Commonly known as: Senokot-S Take 2 tablets  by mouth at bedtime.       Diet: routine diet  Activity: Advance as tolerated. Pelvic rest for 6 weeks.   Outpatient follow up:6 weeks Follow up Appt:No future appointments. Follow up visit: No follow-ups on file.  Postpartum contraception: Not Discussed  Newborn Data: Live born female  Birth Weight: 9 lb 6.4 oz (4265 g) APGAR: 9, 9  Newborn Delivery   Birth date/time: 09/30/2020 14:24:00 Delivery type: C-Section, Low Transverse Trial of labor: No C-section categorization: Repeat      Baby Feeding: Breast Disposition:home with mother   10/02/2020 Lendon Colonel, MD

## 2022-07-31 ENCOUNTER — Ambulatory Visit (HOSPITAL_COMMUNITY): Payer: BC Managed Care – PPO

## 2022-08-17 ENCOUNTER — Other Ambulatory Visit: Payer: Self-pay

## 2022-08-17 ENCOUNTER — Encounter (HOSPITAL_BASED_OUTPATIENT_CLINIC_OR_DEPARTMENT_OTHER): Payer: Self-pay

## 2022-08-17 ENCOUNTER — Emergency Department (HOSPITAL_BASED_OUTPATIENT_CLINIC_OR_DEPARTMENT_OTHER)
Admission: EM | Admit: 2022-08-17 | Discharge: 2022-08-17 | Disposition: A | Discharge status: Home / Self Care | Payer: BC Managed Care – PPO | Attending: Emergency Medicine | Admitting: Emergency Medicine

## 2022-08-17 ENCOUNTER — Emergency Department (HOSPITAL_BASED_OUTPATIENT_CLINIC_OR_DEPARTMENT_OTHER): Payer: BC Managed Care – PPO

## 2022-08-17 DIAGNOSIS — L03213 Periorbital cellulitis: Secondary | ICD-10-CM | POA: Insufficient documentation

## 2022-08-17 DIAGNOSIS — R220 Localized swelling, mass and lump, head: Secondary | ICD-10-CM | POA: Diagnosis present

## 2022-08-17 LAB — BASIC METABOLIC PANEL
Anion gap: 6 (ref 5–15)
BUN: 8 mg/dL (ref 6–20)
CO2: 27 mmol/L (ref 22–32)
Calcium: 9.1 mg/dL (ref 8.9–10.3)
Chloride: 101 mmol/L (ref 98–111)
Creatinine, Ser: 0.61 mg/dL (ref 0.44–1.00)
GFR, Estimated: >60 mL/min (ref >60)
Glucose, Bld: 107 mg/dL — ABNORMAL HIGH (ref 70–99)
Potassium: 3.6 mmol/L (ref 3.5–5.1)
Sodium: 134 mmol/L — ABNORMAL LOW (ref 135–145)

## 2022-08-17 LAB — CBC
HCT: 38.9 % (ref 36.0–46.0)
Hemoglobin: 13.2 g/dL (ref 12.0–15.0)
MCH: 28.9 pg (ref 26.0–34.0)
MCHC: 33.9 g/dL (ref 30.0–36.0)
MCV: 85.1 fL (ref 80.0–100.0)
Platelets: 283 10*3/uL (ref 150–400)
RBC: 4.57 MIL/uL (ref 3.87–5.11)
RDW: 12 % (ref 11.5–15.5)
WBC: 13.1 10*3/uL — ABNORMAL HIGH (ref 4.0–10.5)
nRBC: 0 % (ref 0.0–0.2)

## 2022-08-17 LAB — HCG, SERUM, QUALITATIVE: Preg, Serum: NEGATIVE

## 2022-08-17 MED ORDER — AMOXICILLIN-POT CLAVULANATE 875-125 MG PO TABS: 1.0000 | ORAL_TABLET | Freq: Two times a day (BID) | ORAL | 0 refills | Status: AC

## 2022-08-17 MED ORDER — CLINDAMYCIN HCL 150 MG PO CAPS: 450.0000 mg | ORAL_CAPSULE | Freq: Three times a day (TID) | ORAL | 0 refills | Status: AC

## 2022-08-17 MED ORDER — OXYCODONE HCL 5 MG PO TABS: 5.0000 mg | ORAL_TABLET | Freq: Four times a day (QID) | ORAL | 0 refills | Status: AC | PRN

## 2022-08-17 MED ORDER — OXYCODONE HCL 5 MG PO TABS
5.0000 mg | ORAL_TABLET | ORAL | Status: AC
  Administered 2022-08-17: 5 mg via ORAL
  Filled 2022-08-17: qty 1

## 2022-08-17 MED ORDER — VANCOMYCIN HCL IN DEXTROSE 1-5 GM/200ML-% IV SOLN
1000.0000 mg | Freq: Once | INTRAVENOUS | Status: AC
  Administered 2022-08-17: 1000 mg via INTRAVENOUS
  Filled 2022-08-17: qty 200

## 2022-08-17 MED ORDER — IOHEXOL 300 MG/ML  SOLN
100.0000 mL | Freq: Once | INTRAMUSCULAR | Status: AC | PRN
  Administered 2022-08-17: 75 mL via INTRAVENOUS

## 2022-08-17 MED ORDER — ACETAMINOPHEN 500 MG PO TABS
1000.0000 mg | ORAL_TABLET | Freq: Once | ORAL | Status: AC
  Administered 2022-08-17: 1000 mg via ORAL
  Filled 2022-08-17: qty 2
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
## 2022-08-17 NOTE — ED Triage Notes (Signed)
Patient here POV from Home.  Endorses noted an Area of Localized Swelling to Right Facial Area Last PM and then today awakening with worsening Swelling.   No Known Fevers. Given 2 IM injections of rocephin and began a Course of Doxycycline and Azithromycin but endorses continued swelling.   NAD Noted during Triage. A&Ox4. GCS 15. Ambulatory.

## 2022-08-17 NOTE — Discharge Instructions (Signed)
Today you were seen in the emergency department for your facial swelling (preseptal cellulitis).    In the emergency department you had a CT scan and were given IV antibiotics.    At home, please take the Augmentin and clindamycin that we have prescribed you for your preseptal cellulitis for 1 week.  Alternate Tylenol and Motrin as needed for your pain and ice your face to prevent worsening swelling.  Keep your head elevated even while sleeping to reduce the swelling.  You may use oxycodone for any breakthrough pain that you have but do not take this prior to driving or operating heavy machinery.  Follow-up with your primary doctor in 2-3 days regarding your visit.    Return immediately to the emergency department if you experience any of the following: Worsening pain, rapid spread of the redness around your eye, vision changes, or pain while moving her eyeball, or any other concerning symptoms.    Thank you for visiting our Emergency Department. It was a pleasure taking care of you today.

## 2023-03-21 ENCOUNTER — Ambulatory Visit: Payer: BC Managed Care – PPO | Admitting: Podiatry
